# Patient Record
Sex: Male | Born: 1990
Health system: Southern US, Community
[De-identification: ages and names within clinical notes are randomized; demographics above are authoritative.]

## PROBLEM LIST (undated history)

## (undated) DIAGNOSIS — K7460 Unspecified cirrhosis of liver: Secondary | ICD-10-CM

## (undated) DIAGNOSIS — K8309 Other cholangitis: Secondary | ICD-10-CM

## (undated) DIAGNOSIS — K754 Autoimmune hepatitis: Secondary | ICD-10-CM

## (undated) HISTORY — DX: Unspecified cirrhosis of liver: K74.60

## (undated) HISTORY — PX: LIVER BIOPSY: SHX301

## (undated) HISTORY — DX: Autoimmune hepatitis: K75.4

## (undated) HISTORY — DX: Other cholangitis: K83.09

---
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
## 2010-06-28 ENCOUNTER — Ambulatory Visit: Admit: 2010-06-28 | Payer: Self-pay | Admitting: Gastroenterology

## 2010-06-28 DIAGNOSIS — B199 Unspecified viral hepatitis without hepatic coma: Secondary | ICD-10-CM

## 2010-08-23 LAB — DIFFERENTIAL
Basophils Absolute: 0 10*3/uL (ref 0.0–0.1)
Basophils Relative: 1 % (ref 0–1)
Lymphocytes Relative: 30 % (ref 12–46)
Monocytes Absolute: 0.6 10*3/uL (ref 0.1–1.0)
Neutro Abs: 2.6 10*3/uL (ref 1.7–7.7)

## 2010-08-23 LAB — APTT: aPTT: 43 seconds — ABNORMAL HIGH (ref 24–37)

## 2010-08-23 LAB — PROTIME-INR: INR: 1.9 — ABNORMAL HIGH (ref 0.00–1.49)

## 2010-08-23 LAB — CBC
MCHC: 33.1 g/dL (ref 30.0–36.0)
MCV: 96.3 fL (ref 78.0–100.0)
WBC: 4.9 10*3/uL (ref 4.0–10.5)

## 2010-12-10 DIAGNOSIS — K754 Autoimmune hepatitis: Secondary | ICD-10-CM | POA: Insufficient documentation

## 2011-06-22 ENCOUNTER — Ambulatory Visit (INDEPENDENT_AMBULATORY_CARE_PROVIDER_SITE_OTHER): Payer: Commercial Managed Care - PPO | Admitting: Internal Medicine

## 2011-06-22 DIAGNOSIS — B199 Unspecified viral hepatitis without hepatic coma: Secondary | ICD-10-CM

## 2011-08-03 ENCOUNTER — Ambulatory Visit (INDEPENDENT_AMBULATORY_CARE_PROVIDER_SITE_OTHER): Payer: Commercial Managed Care - PPO | Admitting: Internal Medicine

## 2011-08-03 DIAGNOSIS — Z23 Encounter for immunization: Secondary | ICD-10-CM

## 2011-08-03 DIAGNOSIS — K754 Autoimmune hepatitis: Secondary | ICD-10-CM

## 2011-08-03 MED ORDER — HEPATITIS A VACCINE 1440 EL U/ML IM SUSP
1.0000 mL | Freq: Once | INTRAMUSCULAR | Status: AC
  Administered 2011-08-03: 1440 [IU] via INTRAMUSCULAR

## 2011-08-03 MED ORDER — HEPATITIS B VAC RECOMBINANT 5 MCG/0.5ML IJ SUSP
0.5000 mL | Freq: Once | INTRAMUSCULAR | Status: AC
  Administered 2011-08-03: 20 ug via INTRAMUSCULAR

## 2011-08-03 MED ORDER — HEPATITIS B VAC RECOMBINANT 20 MCG/ML IM INJ: 20.0000 ug | INJECTION | Freq: Once | INTRAMUSCULAR | Status: DC

## 2011-08-03 NOTE — Progress Notes (Signed)
  Subjective:    Patient ID: Thomas Terrell, male    DOB: [birth year], 21 y.o.   MRN: [id number]  HPI Here for hepatitis A vaccine #2 and hepatitis B vaccine #3 to complete his course as needed for treatment of his autoimmune hepatitis   Review of SystemsProblems from her last visit in January resolved     Objective:   Physical ExamNone needed today        Assessment & Plan:  Problem #1Autoimmune hepatitis  Vaccines given

## 2011-10-25 ENCOUNTER — Encounter: Payer: Self-pay | Admitting: Internal Medicine

## 2011-10-28 ENCOUNTER — Ambulatory Visit: Payer: Commercial Managed Care - PPO

## 2011-10-28 ENCOUNTER — Ambulatory Visit (INDEPENDENT_AMBULATORY_CARE_PROVIDER_SITE_OTHER): Payer: Commercial Managed Care - PPO | Admitting: Family Medicine

## 2011-10-28 VITALS — BP 128/76 | HR 73 | Temp 98.2°F | Resp 18 | Ht 70.0 in | Wt 206.8 lb

## 2011-10-28 DIAGNOSIS — T148XXA Other injury of unspecified body region, initial encounter: Secondary | ICD-10-CM

## 2011-10-28 MED ORDER — DOXYCYCLINE HYCLATE 100 MG PO TABS: 100.0000 mg | ORAL_TABLET | Freq: Two times a day (BID) | ORAL | Status: AC

## 2011-10-28 NOTE — Progress Notes (Signed)
  Subjective:    Patient ID: Thomas Terrell, male    DOB: 07/17/1990, 21 y.o.   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 Screw removed without difficulty however yesterday developed some redness and induration of that digit.  Patient denies pain or limitation in ROM  Needs Tdap.   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  Cardiovascular: Normal rate, regular rhythm and normal heart sounds.   Pulmonary/Chest: Effort normal and breath sounds normal.  Abdominal: Soft. Bowel sounds are normal.  Musculoskeletal:       Hands:    [facility] reading (PRIMARY) by  Dr. Hal Hope No retained metallic fragment or fracture     Assessment & Plan:  Traumatic injury (L) 3rd finger; early secondary infection NAFLD/ Sclerosing cholangitis/ auto immune hepatitis/ immunosuppresion   Doxycycline 100 mg BID X 10 days RTC if symptoms worsen  Tdap Mother and patient in agreement with plan

## 2011-12-26 ENCOUNTER — Ambulatory Visit (INDEPENDENT_AMBULATORY_CARE_PROVIDER_SITE_OTHER): Payer: Commercial Managed Care - PPO | Admitting: Internal Medicine

## 2011-12-26 VITALS — BP 120/70 | HR 56 | Temp 98.0°F | Resp 16 | Ht 70.5 in | Wt 187.0 lb

## 2011-12-26 DIAGNOSIS — L02519 Cutaneous abscess of unspecified hand: Secondary | ICD-10-CM

## 2011-12-26 DIAGNOSIS — L03019 Cellulitis of unspecified finger: Secondary | ICD-10-CM

## 2011-12-26 MED ORDER — DOXYCYCLINE HYCLATE 100 MG PO TABS: 100.0000 mg | ORAL_TABLET | Freq: Two times a day (BID) | ORAL | Status: DC

## 2011-12-26 NOTE — Progress Notes (Signed)
  Subjective:    Patient ID: Thomas Terrell, male    DOB: Dec 08, 1990, 21 y.o.   MRN: [id number]  HPIRecurrence of finger infection Originally responded well but has had tenderness redness and slight pus over the last week The nail is disrupted medially and has begun to flake off with the distal nail intact No new injury    Review of Systems     Objective:   Physical Exam  Nail changes as noted/there is a fistula tract on the medial aspect of the finger distal phalanx with tenderness and slight redness but no pus and no involvement of the PIP joint      Assessment & Plan:  Problem #1 relapse of finger infection Plan-doxycycline 100 mg twice a day for 10 days If this does not resolve we may need to consider a retained foreign body or even a low-grade osteomyelitis

## 2012-01-16 ENCOUNTER — Ambulatory Visit (INDEPENDENT_AMBULATORY_CARE_PROVIDER_SITE_OTHER): Payer: Commercial Managed Care - PPO | Admitting: Emergency Medicine

## 2012-01-16 ENCOUNTER — Ambulatory Visit: Payer: Commercial Managed Care - PPO

## 2012-01-16 VITALS — BP 92/54 | HR 62 | Temp 97.8°F | Resp 18 | Wt 202.0 lb

## 2012-01-16 DIAGNOSIS — M79609 Pain in unspecified limb: Secondary | ICD-10-CM

## 2012-01-16 DIAGNOSIS — M79646 Pain in unspecified finger(s): Secondary | ICD-10-CM

## 2012-01-16 NOTE — Progress Notes (Signed)
   Date:  01/16/2012   Name:  Thomas Terrell   DOB:  Jul 23, 1990   MRN:  [id number] Gender: male  Age: 21 y.o.  PCP:  Tonye Pearson, MD    Chief Complaint: Follow-up   History of Present Illness:  Thomas Terrell is a 21 y.o. pleasant patient who presents with the following:  Injured several weeks ago when his brother drove a wood screw into his middle finger.  Initially did well but developed infection and treated with doxy.  Now for wound recheck and still having pain and insensitivity.  No further drainage.  There is no problem list on file for this patient.   No past medical history on file.  No past surgical history on file.  History  Substance Use Topics  . Smoking status: Never Smoker   . Smokeless tobacco: Not on file  . Alcohol Use: Not on file    No family history on file.  Allergies  Allergen Reactions  . Latex Swelling    Redness and swelling.    Medication list has been reviewed and updated.  Current Outpatient Prescriptions on File Prior to Visit  Medication Sig Dispense Refill  . AzaTHIOprine (IMURAN PO) Take 150 mg by mouth daily.      . predniSONE (STERAPRED UNI-PAK) 10 MG tablet Take 10 mg by mouth daily.      Marland Kitchen PREDNISONE PO Take 7.5 mg by mouth daily.       Current Facility-Administered Medications on File Prior to Visit  Medication Dose Route Frequency Provider Last Rate Last Dose  . Hepatitis B Vac Recombinant INJ 20 mcg  20 mcg Intramuscular Once Tonye Pearson, MD        Review of Systems:  As per HPI, otherwise negative.    Physical Examination: Filed Vitals:   01/16/12 1348  BP: 92/54  Pulse: 62  Temp: 97.8 F (36.6 C)  Resp: 18   Filed Vitals:   01/16/12 1348  Weight: 202 lb (91.627 kg)   There is no height on file to calculate BMI. Ideal Body Weight:     GEN: WDWN, NAD, Non-toxic, Alert & Oriented x 3 HEENT: Atraumatic, Normocephalic.  Ears and Nose: No external deformity. EXTR: No  clubbing/cyanosis/edema NEURO: Normal gait.  PSYCH: Normally interactive. Conversant. Not depressed or anxious appearing.  Calm demeanor.  Finger lateral nail fold somewhat deformed.  No drainage.  Normal sensation .  Full PROM and AROM  Assessment and Plan: Finger pain Refer hand surgeon  [facility] reading (PRIMARY) by  Dr. Dareen Piano.  Negative .    Carmelina Dane, MD

## 2012-04-21 ENCOUNTER — Ambulatory Visit (INDEPENDENT_AMBULATORY_CARE_PROVIDER_SITE_OTHER): Payer: 59 | Admitting: Internal Medicine

## 2012-04-21 VITALS — BP 117/68 | HR 87 | Temp 98.5°F | Resp 16 | Ht 72.0 in | Wt 193.8 lb

## 2012-04-21 DIAGNOSIS — Z23 Encounter for immunization: Secondary | ICD-10-CM

## 2012-04-21 DIAGNOSIS — H6120 Impacted cerumen, unspecified ear: Secondary | ICD-10-CM

## 2012-04-21 DIAGNOSIS — H9203 Otalgia, bilateral: Secondary | ICD-10-CM

## 2012-04-21 DIAGNOSIS — H9209 Otalgia, unspecified ear: Secondary | ICD-10-CM

## 2012-04-21 DIAGNOSIS — K754 Autoimmune hepatitis: Secondary | ICD-10-CM

## 2012-04-21 MED ORDER — AMOXICILLIN 875 MG PO TABS: 875.0000 mg | ORAL_TABLET | Freq: Two times a day (BID) | ORAL | Status: DC

## 2012-04-22 DIAGNOSIS — K754 Autoimmune hepatitis: Secondary | ICD-10-CM | POA: Insufficient documentation

## 2012-04-22 NOTE — Progress Notes (Signed)
  Subjective:    Patient ID: Thomas Terrell, male    DOB: 1991-02-16, 21 y.o.   MRN: [id number]  HPIotalgia-2 days [place] Peers 2 weeks decr hear R No ST,cough  Needs flu sh    Review of Systems     Objective:   Physical Exam NAD R canal occluded L canal clear dull tm Nares boggy thr clear No nodes   Irrigation successful-and R Tm rd w/ fluid     Assessment & Plan:   1. Cerumen impaction    2. Needs flu shot  Flu vaccine greater than or equal to 3yo preservative free IM  3. Otalgia of both ears    4. Hepatitis, autoimmune    5.  OM on R Meds ordered this encounter  Medications  . amoxicillin (AMOXIL) 875 MG tablet    Sig: Take 1 tablet (875 mg total) by mouth 2 (two) times daily.    Dispense:  20 tablet    Refill:  0

## 2013-04-24 ENCOUNTER — Ambulatory Visit (INDEPENDENT_AMBULATORY_CARE_PROVIDER_SITE_OTHER): Payer: 59 | Admitting: Family Medicine

## 2013-04-24 DIAGNOSIS — Z23 Encounter for immunization: Secondary | ICD-10-CM

## 2013-06-06 ENCOUNTER — Ambulatory Visit: Payer: 59

## 2013-06-06 ENCOUNTER — Ambulatory Visit (INDEPENDENT_AMBULATORY_CARE_PROVIDER_SITE_OTHER): Payer: 59 | Admitting: Family Medicine

## 2013-06-06 VITALS — BP 122/74 | HR 79 | Temp 98.0°F | Resp 16 | Ht 73.8 in | Wt 198.0 lb

## 2013-06-06 DIAGNOSIS — R0781 Pleurodynia: Secondary | ICD-10-CM

## 2013-06-06 DIAGNOSIS — R071 Chest pain on breathing: Secondary | ICD-10-CM

## 2013-06-06 DIAGNOSIS — J069 Acute upper respiratory infection, unspecified: Secondary | ICD-10-CM

## 2013-06-06 DIAGNOSIS — K754 Autoimmune hepatitis: Secondary | ICD-10-CM

## 2013-06-06 LAB — POCT CBC
GRANULOCYTE PERCENT: 65.2 % (ref 37–80)
HEMATOCRIT: 49.7 % (ref 43.5–53.7)
HEMOGLOBIN: 15.9 g/dL (ref 14.1–18.1)
Lymph, poc: 1.2 (ref 0.6–3.4)
MCH, POC: 31.4 pg — AB (ref 27–31.2)
MCHC: 32 g/dL (ref 31.8–35.4)
MCV: 98.2 fL — AB (ref 80–97)
MID (CBC): 0.5 (ref 0–0.9)
MPV: 10.7 fL (ref 0–99.8)
POC GRANULOCYTE: 3.1 (ref 2–6.9)
POC LYMPH PERCENT: 24.5 %L (ref 10–50)
POC MID %: 10.3 % (ref 0–12)
Platelet Count, POC: 149 10*3/uL (ref 142–424)
RBC: 5.06 M/uL (ref 4.69–6.13)
RDW, POC: 15.4 %
WBC: 4.7 10*3/uL (ref 4.6–10.2)

## 2013-06-06 MED ORDER — AMOXICILLIN-POT CLAVULANATE 875-125 MG PO TABS: 1.0000 | ORAL_TABLET | Freq: Two times a day (BID) | ORAL | Status: DC

## 2013-06-06 MED ORDER — BENZONATATE 100 MG PO CAPS: 100.0000 mg | ORAL_CAPSULE | Freq: Three times a day (TID) | ORAL | Status: DC | PRN

## 2013-06-06 MED ORDER — IPRATROPIUM BROMIDE 0.03 % NA SOLN: 2.0000 | Freq: Two times a day (BID) | NASAL | Status: DC

## 2013-06-06 NOTE — Patient Instructions (Signed)

## 2013-06-06 NOTE — Progress Notes (Signed)
Subjective:    Patient ID: Thomas Terrell, male    DOB: [birth year], 23 y.o.   MRN: [id number]  HPI  Scribed for Nilda SimmerKristi Smith MD, the patient was seen in room 1. This chart was scribed by Lewanda RifeAlexandra Hurtado, ED scribe. Patient's care was started at 8:53 AM Hx was provided by the pt.  HPI Comments: Thomas Terrell is a 23 y.o. male who presents to the [facility] with PMHx of pneumonia, autoimmune hepatitis complaining of constant moderate sore throat and cough onset 7 days. Reports associated mild productive cough with yellow sputum, rhinorrhea with clear mucus, congestion, and difficulty breathing; +pain with deep inspiration B anterior chest. Reports trying dayquil and nyquil with no relief of symptoms. Denies associated fever, chills, nausea, emesis, diarrhea, wheeze, generalized myalgias, otalgia, and sweats. Reports siblings sick contacts. Denies PMHx of asthma and smoking. Reports getting flu shot this year.  Past Medical History  Diagnosis Date  . Autoimmune hepatitis treated with steroids     History reviewed. No pertinent past surgical history.  Family History  Problem Relation Age of Onset  . Diabetes Father   . Heart disease Father   . Hypertension Father     History   Social History  . Marital Status: Single    Spouse Name: N/A    Number of Children: N/A  . Years of Education: N/A   Occupational History  . Not on file.   Social History Main Topics  . Smoking status: Never Smoker   . Smokeless tobacco: Not on file  . Alcohol Use: No  . Drug Use: No  . Sexual Activity: No   Other Topics Concern  . Not on file   Social History Narrative  . No narrative on file    Allergies  Allergen Reactions  . Latex Swelling    Redness and swelling.    Patient Active Problem List   Diagnosis Date Noted  . Hepatitis, autoimmune 04/22/2012      Review of Systems  Constitutional: Negative for fever, chills, diaphoresis and fatigue.  HENT: Positive  for congestion, postnasal drip, rhinorrhea, sinus pressure and sore throat. Negative for ear pain and trouble swallowing.   Respiratory: Positive for cough. Negative for shortness of breath and wheezing.   Gastrointestinal: Negative.  Negative for nausea, vomiting and diarrhea.  Skin: Negative for rash.   A complete 10 system review of systems was obtained and all systems are negative except as noted in the HPI and PMHx.       Objective:   Physical Exam  Nursing note and vitals reviewed. Constitutional: He is oriented to person, place, and time. He appears well-developed and well-nourished. No distress.  HENT:  Head: Normocephalic and atraumatic.  Right Ear: Tympanic membrane, external ear and ear canal normal.  Left Ear: Tympanic membrane, external ear and ear canal normal.  Nose: Mucosal edema and rhinorrhea present. Right sinus exhibits maxillary sinus tenderness. Right sinus exhibits no frontal sinus tenderness. Left sinus exhibits maxillary sinus tenderness. Left sinus exhibits no frontal sinus tenderness.  Mouth/Throat: Uvula is midline and mucous membranes are normal. Mucous membranes are not dry. No uvula swelling. No oropharyngeal exudate, posterior oropharyngeal edema, posterior oropharyngeal erythema or tonsillar abscesses.  Eyes: Conjunctivae and EOM are normal. Pupils are equal, round, and reactive to light.  Neck: Normal range of motion. Neck supple. No tracheal deviation present.  Cardiovascular: Normal rate and regular rhythm.   No murmur heard. Pulmonary/Chest: Effort normal and breath sounds normal. No  respiratory distress. He has no wheezes. He has no rales.  Musculoskeletal: Normal range of motion.  Lymphadenopathy:    He has no cervical adenopathy.  Neurological: He is alert and oriented to person, place, and time.  Skin: Skin is warm and dry. He is not diaphoretic.  Psychiatric: He has a normal mood and affect. His behavior is normal.      DIAGNOSTIC  STUDIES: Oxygen Saturation is 100% on room air, normal by my interpretation.    COORDINATION OF CARE:  Nursing notes reviewed. Vital signs reviewed. Initial pt interview and examination performed.   8:53 AM-Discussed work up plan with pt at bedside, which includes CXR, and CBC with diff panel. Pt agrees with plan.  Treatment plan initiated:Medications - No data to display   Initial diagnostic testing ordered.    Results for orders placed in visit on 06/06/13  POCT CBC      Result Value Range   WBC 4.7  4.6 - 10.2 K/uL   Lymph, poc 1.2  0.6 - 3.4   POC LYMPH PERCENT 24.5  10 - 50 %L   MID (cbc) 0.5  0 - 0.9   POC MID % 10.3  0 - 12 %M   POC Granulocyte 3.1  2 - 6.9   Granulocyte percent 65.2  37 - 80 %G   RBC 5.06  4.69 - 6.13 M/uL   Hemoglobin 15.9  14.1 - 18.1 g/dL   HCT, POC 57.8  46.9 - 53.7 %   MCV 98.2 (*) 80 - 97 fL   MCH, POC 31.4 (*) 27 - 31.2 pg   MCHC 32.0  31.8 - 35.4 g/dL   RDW, POC 62.9     Platelet Count, POC 149  142 - 424 K/uL   MPV 10.7  0 - 99.8 fL   [facility] reading (PRIMARY) by  Dr. Katrinka Blazing.  CXR:  NAD.      Assessment & Plan:  URI (upper respiratory infection) - Plan: POCT CBC, DG Chest 2 View  Pleuritic chest pain  Hepatitis, autoimmune  1.  Acute Sinusitis/URI:  New. Rx for Augmentin bid provided, Atrovent nasal spray, Tessalon Perles.  RTC for acute worsening or lack of improvement. 2.  Chest pain pleuritic:  New.  No evidence of infiltrate by CXR.  Secondary to coughing. 3.  Autoimmune Hepatitis:  Stable; maintained on Prednisone daily.    Meds ordered this encounter  Medications  . amoxicillin-clavulanate (AUGMENTIN) 875-125 MG per tablet    Sig: Take 1 tablet by mouth 2 (two) times daily.    Dispense:  20 tablet    Refill:  0  . ipratropium (ATROVENT) 0.03 % nasal spray    Sig: Place 2 sprays into the nose 2 (two) times daily.    Dispense:  30 mL    Refill:  0  . benzonatate (TESSALON) 100 MG capsule    Sig: Take 1-2 capsules (100-200  mg total) by mouth 3 (three) times daily as needed for cough.    Dispense:  40 capsule    Refill:  0   I personally performed the services described in this documentation, which was scribed in my presence.  The recorded information has been reviewed and is accurate.  Nilda Simmer, M.D.  [facility] [address] [place], [place]  [phone] [phone] phone [phone] fax

## 2013-08-15 ENCOUNTER — Ambulatory Visit (INDEPENDENT_AMBULATORY_CARE_PROVIDER_SITE_OTHER): Payer: 59 | Admitting: Family Medicine

## 2013-08-15 VITALS — BP 122/70 | HR 66 | Temp 98.2°F | Resp 18 | Ht 70.0 in | Wt 189.0 lb

## 2013-08-15 DIAGNOSIS — J209 Acute bronchitis, unspecified: Secondary | ICD-10-CM

## 2013-08-15 DIAGNOSIS — L600 Ingrowing nail: Secondary | ICD-10-CM

## 2013-08-15 MED ORDER — DOXYCYCLINE HYCLATE 100 MG PO TABS
100.0000 mg | ORAL_TABLET | Freq: Two times a day (BID) | ORAL | Status: AC
Start: 2013-08-15 — End: 2013-08-25

## 2013-08-15 MED ORDER — HYDROCODONE-HOMATROPINE 5-1.5 MG/5ML PO SYRP: 5.0000 mL | ORAL_SOLUTION | Freq: Three times a day (TID) | ORAL | Status: DC | PRN

## 2013-08-15 NOTE — Progress Notes (Signed)
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 He's had 2 weeks of cough in 2 weeks of ingrown toenail on the right great toe.  Patient was seen over week ago and put on Tessalon Perles as well as Augmentin. He has not improved at all.  Patient's past medical history is significant for chronic hepatitis thought to be autoimmune. He takes prednisone 5 mg daily.  Objective: Patient in no acute distress, alert and cooperative HEENT: Unremarkable Chest: A few faint wheezes Heart: Regular no murmur Skin: Patient does have swollen cuticle on the right toe, tibial side of the nail.  Assessment: Persistent, probably cyclical cough with an ingrown toenail which is not associated with the lung symptoms.  Plan:Acute bronchitis - Plan: doxycycline (VIBRA-TABS) 100 MG tablet, HYDROcodone-homatropine (HYCODAN) 5-1.5 MG/5ML syrup  Ingrown right greater toenail - Plan: doxycycline (VIBRA-TABS) 100 MG tablet  Signed, Elvina SidleKurt Tonga Prout, MD

## 2013-08-15 NOTE — Patient Instructions (Signed)

## 2014-03-24 ENCOUNTER — Telehealth: Payer: Self-pay

## 2014-03-24 DIAGNOSIS — K754 Autoimmune hepatitis: Secondary | ICD-10-CM

## 2014-03-24 NOTE — Telephone Encounter (Signed)
Pt mom states that it has been a while with [facility] and needs a referral for the follow up   Please call [phone]

## 2014-03-26 NOTE — Telephone Encounter (Signed)
Done

## 2014-03-26 NOTE — Telephone Encounter (Signed)
Ok to refer.

## 2014-06-09 ENCOUNTER — Ambulatory Visit (INDEPENDENT_AMBULATORY_CARE_PROVIDER_SITE_OTHER): Payer: 59 | Admitting: Family Medicine

## 2014-06-09 VITALS — BP 142/70 | HR 69 | Temp 97.3°F | Resp 16 | Ht 72.05 in | Wt 213.0 lb

## 2014-06-09 DIAGNOSIS — J0100 Acute maxillary sinusitis, unspecified: Secondary | ICD-10-CM

## 2014-06-09 DIAGNOSIS — J069 Acute upper respiratory infection, unspecified: Secondary | ICD-10-CM

## 2014-06-09 MED ORDER — AMOXICILLIN 500 MG PO CAPS
500.0000 mg | ORAL_CAPSULE | Freq: Three times a day (TID) | ORAL | Status: DC
Start: 2014-06-09 — End: 2014-07-09

## 2014-06-09 NOTE — Progress Notes (Signed)
Chief Complaint:  Chief Complaint  Patient presents with  . Cough    x4 days; clear to yellow sputum  . Nasal Congestion    HPI: Thomas Terrell is a 24 y.o. male who is here for  5  Day history of feeling terrible, he has had sick contacts, sister with similar sxs and dad has PNA.  Coughing up yellow sputum, not since Friday He had had chills last night and ahs been taking dayquil and nyquil He has facial pain and HA, mild ear pain.  His liver enzymes have been back to normal.    Past Medical History  Diagnosis Date  . Autoimmune hepatitis treated with steroids    History reviewed. No pertinent past surgical history. History   Social History  . Marital Status: Single    Spouse Name: N/A    Number of Children: N/A  . Years of Education: N/A   Social History Main Topics  . Smoking status: Never Smoker   . Smokeless tobacco: None  . Alcohol Use: No  . Drug Use: No  . Sexual Activity: No   Other Topics Concern  . None   Social History Narrative   Family History  Problem Relation Age of Onset  . Diabetes Father   . Heart disease Father   . Hypertension Father    Allergies  Allergen Reactions  . Latex Swelling    Redness and swelling.   Prior to Admission medications   Medication Sig Start Date End Date Taking? Authorizing Provider  AzaTHIOprine (IMURAN PO) Take 100 mg by mouth daily.    Yes Historical Provider, MD  predniSONE (DELTASONE) 5 MG tablet Take 5 mg by mouth daily with breakfast.   Yes Historical Provider, MD     ROS: The patient denies night sweats, unintentional weight loss, chest pain, palpitations, wheezing, dyspnea on exertion, nausea, vomiting, abdominal pain, dysuria, hematuria, melena, numbness, weakness, or tingling.   All other systems have been reviewed and were otherwise negative with the exception of those mentioned in the HPI and as above.    PHYSICAL EXAM: Filed Vitals:   06/09/14 1035  BP: 142/70  Pulse: 69  Temp: 97.3  F (36.3 C)  Resp: 16   Filed Vitals:   06/09/14 1035  Height: 6' 0.05" (1.83 m)  Weight: 213 lb (96.616 kg)   Body mass index is 28.85 kg/(m^2).  General: Alert, no acute distress HEENT:  Normocephalic, atraumatic, oropharynx patent. EOMI, PERRLA Erythematous throat, no exudates, TM normal, + sinus tenderness, + erythematous/boggy nasal mucosa Cardiovascular:  Regular rate and rhythm, no rubs murmurs or gallops.  Radial pulse intact. No pedal edema.  Respiratory: Clear to auscultation bilaterally.  No wheezes, rales, or rhonchi.  No cyanosis, no use of accessory musculature GI: No organomegaly, abdomen is soft and non-tender, positive bowel sounds.  No masses. Skin: No rashes. Neurologic: Facial musculature symmetric. Psychiatric: Patient is appropriate throughout our interaction. Lymphatic: No cervical lymphadenopathy Musculoskeletal: Gait intact.   LABS: Results for orders placed or performed in visit on 06/06/13  POCT CBC  Result Value Ref Range   WBC 4.7 4.6 - 10.2 K/uL   Lymph, poc 1.2 0.6 - 3.4   POC LYMPH PERCENT 24.5 10 - 50 %L   MID (cbc) 0.5 0 - 0.9   POC MID % 10.3 0 - 12 %M   POC Granulocyte 3.1 2 - 6.9   Granulocyte percent 65.2 37 - 80 %G   RBC 5.06 4.69 - 6.13  M/uL   Hemoglobin 15.9 14.1 - 18.1 g/dL   HCT, POC 52.8 41.3 - 53.7 %   MCV 98.2 (A) 80 - 97 fL   MCH, POC 31.4 (A) 27 - 31.2 pg   MCHC 32.0 31.8 - 35.4 g/dL   RDW, POC 24.4 %   Platelet Count, POC 149 142 - 424 K/uL   MPV 10.7 0 - 99.8 fL     EKG/XRAY:   Primary read interpreted by Dr. Conley Rolls at [facility].   ASSESSMENT/PLAN: Encounter Diagnoses  Name Primary?  . Acute upper respiratory infection Yes  . Acute maxillary sinusitis, recurrence not specified    Rx amoxacillin Saline ns F/u prn  Gross sideeffects, risk and benefits, and alternatives of medications d/w patient. Patient is aware that all medications have potential sideeffects and we are unable to predict every sideeffect or drug-drug  interaction that may occur.  LE, [name], DO 06/09/2014 11:40 AM

## 2014-06-09 NOTE — Patient Instructions (Signed)

## 2014-07-09 ENCOUNTER — Encounter: Payer: Self-pay | Admitting: Internal Medicine

## 2014-07-09 ENCOUNTER — Ambulatory Visit (INDEPENDENT_AMBULATORY_CARE_PROVIDER_SITE_OTHER): Payer: 59 | Admitting: Internal Medicine

## 2014-07-09 VITALS — BP 137/78 | HR 82 | Temp 98.0°F | Resp 16 | Ht 70.5 in | Wt 211.0 lb

## 2014-07-09 DIAGNOSIS — Z92241 Personal history of systemic steroid therapy: Secondary | ICD-10-CM

## 2014-07-09 DIAGNOSIS — K754 Autoimmune hepatitis: Secondary | ICD-10-CM

## 2014-07-09 DIAGNOSIS — Z0000 Encounter for general adult medical examination without abnormal findings: Secondary | ICD-10-CM

## 2014-07-09 DIAGNOSIS — Z23 Encounter for immunization: Secondary | ICD-10-CM

## 2014-07-09 MED ORDER — PREDNISONE 5 MG PO TABS: 5.0000 mg | ORAL_TABLET | Freq: Every day | ORAL | Status: DC

## 2014-07-10 DIAGNOSIS — Z92241 Personal history of systemic steroid therapy: Secondary | ICD-10-CM | POA: Insufficient documentation

## 2014-07-10 NOTE — Progress Notes (Signed)
   Subjective:    Patient ID: Thomas Terrell, male    DOB: Feb 09, 1991, 24 y.o.   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 [facility] for autoimmune hepatitis 03/07/2014 [facility]: WBC 5.5, HGb 15, Hct 45, platelets 151,000, ALT 33, AST 27, AP 128, GGT 130, T bili 0.6, albumin 4.3. He continues on prednisone 5 mg daily and Imuran 100 mg daily  Immunizations are up-to-date His father whom he resembles morphologically has significant degenerative disease throughout his spine, severe asthma, and anger management disorder.  Review of Systems 14 point review of systems is negative per form    Objective:   Physical Exam  Constitutional: He is oriented to person, place, and time. He appears well-developed and well-nourished.  HENT:  Head: Normocephalic and atraumatic.  Right Ear: Hearing, tympanic membrane, external ear and ear canal normal.  Left Ear: Hearing, tympanic membrane, external ear and ear canal normal.  Nose: Nose normal.  Mouth/Throat: Uvula is midline, oropharynx is clear and moist and mucous membranes are normal.  Eyes: Conjunctivae, EOM and lids are normal. Pupils are equal, round, and reactive to light. Right eye exhibits no discharge. Left eye exhibits no discharge. No scleral icterus.  Neck: Normal range of motion. Neck supple. Carotid bruit is not present. No thyromegaly present.  Cardiovascular: Normal rate, regular rhythm, normal heart sounds, intact distal pulses and normal pulses.   No murmur heard. Pulmonary/Chest: Effort normal and breath sounds normal. No respiratory distress. He has no wheezes. He has no rhonchi. He has no rales.  Abdominal: Soft. Normal appearance and bowel sounds are normal. He exhibits no abdominal bruit and no mass. There is no tenderness.    Musculoskeletal: Normal range of motion. He exhibits no edema or tenderness.  Lymphadenopathy:    He has no cervical adenopathy.  Neurological: He is alert and oriented to person, place, and time. He has normal strength and normal reflexes. No cranial nerve deficit or sensory deficit. Coordination and gait normal.  Skin: Skin is warm, dry and intact. No lesion and no rash noted.  Psychiatric: He has a normal mood and affect. His speech is normal and behavior is normal. Judgment and thought content normal.  BP 137/78 mmHg  Pulse 82  Temp(Src) 98 F (36.7 C)  Resp 16  Ht 5' 10.5" (1.791 m)  Wt 211 lb (95.709 kg)  BMI 29.84 kg/m2  SpO2 99%     Assessment & Plan:  Annual exam Autoimmune hepatitis stable on current regimen Chronic steroid use  DEXA scan  Follow-up one year

## 2014-07-18 ENCOUNTER — Ambulatory Visit (HOSPITAL_COMMUNITY)
Admission: RE | Admit: 2014-07-18 | Discharge: 2014-07-18 | Disposition: A | Discharge status: Home / Self Care | Payer: 59 | Source: Ambulatory Visit | Attending: Internal Medicine | Admitting: Internal Medicine

## 2014-07-18 DIAGNOSIS — Z13820 Encounter for screening for osteoporosis: Secondary | ICD-10-CM | POA: Diagnosis present

## 2014-07-18 DIAGNOSIS — K754 Autoimmune hepatitis: Secondary | ICD-10-CM | POA: Diagnosis not present

## 2014-07-18 DIAGNOSIS — Z92241 Personal history of systemic steroid therapy: Secondary | ICD-10-CM | POA: Diagnosis not present

## 2014-08-04 ENCOUNTER — Encounter: Payer: Self-pay | Admitting: Radiology

## 2014-08-07 ENCOUNTER — Encounter: Payer: Self-pay | Admitting: Internal Medicine

## 2014-12-02 ENCOUNTER — Ambulatory Visit (INDEPENDENT_AMBULATORY_CARE_PROVIDER_SITE_OTHER): Payer: 59 | Admitting: Family Medicine

## 2014-12-02 VITALS — BP 120/80 | HR 60 | Temp 97.8°F | Resp 16 | Ht 72.0 in | Wt 229.0 lb

## 2014-12-02 DIAGNOSIS — H8110 Benign paroxysmal vertigo, unspecified ear: Secondary | ICD-10-CM

## 2014-12-02 MED ORDER — ONDANSETRON HCL 8 MG PO TABS: 8.0000 mg | ORAL_TABLET | Freq: Three times a day (TID) | ORAL | Status: DC | PRN

## 2014-12-02 MED ORDER — MECLIZINE HCL 25 MG PO TABS: 25.0000 mg | ORAL_TABLET | Freq: Three times a day (TID) | ORAL | Status: DC | PRN

## 2014-12-02 NOTE — Patient Instructions (Signed)
It seems that you have BPPV- a type of vertigo.  This will generally resolve in a couple of days.  Use the meclizine as needed for dizziness and the zofran as needed for nausea Let me know if you are not better soon- Sooner if worse.   Do not drive until the dizziness resolves   hBenign Positional Vertigo Vertigo means you feel like you or your surroundings are moving when they are not. Benign positional vertigo is the most common form of vertigo. Benign means that the cause of your condition is not serious. Benign positional vertigo is more common in older adults. CAUSES  Benign positional vertigo is the result of an upset in the labyrinth system. This is an area in the middle ear that helps control your balance. This may be caused by a viral infection, head injury, or repetitive motion. However, often no specific cause is found. SYMPTOMS  Symptoms of benign positional vertigo occur when you move your head or eyes in different directions. Some of the symptoms may include:  Loss of balance and falls.  Vomiting.  Blurred vision.  Dizziness.  Nausea.  Involuntary eye movements (nystagmus). DIAGNOSIS  Benign positional vertigo is usually diagnosed by physical exam. If the specific cause of your benign positional vertigo is unknown, your caregiver may perform imaging tests, such as magnetic resonance imaging (MRI) or computed tomography (CT). TREATMENT  Your caregiver may recommend movements or procedures to correct the benign positional vertigo. Medicines such as meclizine, benzodiazepines, and medicines for nausea may be used to treat your symptoms. In rare cases, if your symptoms are caused by certain conditions that affect the inner ear, you may need surgery. HOME CARE INSTRUCTIONS   Follow your caregiver's instructions.  Move slowly. Do not make sudden body or head movements.  Avoid driving.  Avoid operating heavy machinery.  Avoid performing any tasks that would be dangerous to  you or others during a vertigo episode.  Drink enough fluids to keep your urine clear or pale yellow. SEEK IMMEDIATE MEDICAL CARE IF:   You develop problems with walking, weakness, numbness, or using your arms, hands, or legs.  You have difficulty speaking.  You develop severe headaches.  Your nausea or vomiting continues or gets worse.  You develop visual changes.  Your family or friends notice any behavioral changes.  Your condition gets worse.  You have a fever.  You develop a stiff neck or sensitivity to light. MAKE SURE YOU:   Understand these instructions.  Will watch your condition.  Will get help right away if you are not doing well or get worse. Document Released: 02/28/2006 Document Revised: 08/15/2011 Document Reviewed: 02/10/2011 [facility] Patient Information 2015 [employer] [place]. This information is not intended to replace advice given to you by your health care provider. Make sure you discuss any questions you have with your health care provider.

## 2014-12-02 NOTE — Progress Notes (Signed)
[facility] [address] [place] [address] [phone]  Date:  12/02/2014   Name:  Thomas Terrell   DOB:  November 26, 1990   MRN:  [id number]  PCP:  Tonye Pearson, MD    Chief Complaint: Dizziness; Headache; and Emesis   History of Present Illness:  Thomas Terrell is a 24 y.o. very pleasant male patient who presents with the following:  History of autoimmune hepatitis, followed by [facility] hepatology clinic and maintained on prednisone and imuran daily.   Recent liver labs looked good per his report He awoke this am and noted that he had a spinning sensation.   This caused him to vomit twice.  No abdominal pain- the vomiting seems to be due to the vertigo sensation The spinning will be worse if he moves He is better when holding still He is not sure how long the spinning takes to resolve when he stops moving- he first said 4-5 minutes but then reconsidered, maybe a minute or less He has a minor HA.   No tinnitus or hearing change.   Vision is ok He has not noted a fever.    He just started a new job last week and needs a note.    Patient Active Problem List   Diagnosis Date Noted  . History of systemic steroid therapy 07/10/2014  . Hepatitis, autoimmune 04/22/2012  . Autoimmune hepatitis 12/10/2010    Past Medical History  Diagnosis Date  . Autoimmune hepatitis treated with steroids     History reviewed. No pertinent past surgical history.  History  Substance Use Topics  . Smoking status: Never Smoker   . Smokeless tobacco: Not on file  . Alcohol Use: No    Family History  Problem Relation Age of Onset  . Diabetes Father   . Heart disease Father   . Hypertension Father     Allergies  Allergen Reactions  . Latex Swelling    Redness and swelling.    Medication list has been reviewed and updated.  Current Outpatient Prescriptions on File Prior to Visit  Medication Sig Dispense Refill  . azaTHIOprine (IMURAN) 50 MG tablet   5  .  predniSONE (DELTASONE) 5 MG tablet Take 1 tablet (5 mg total) by mouth daily with breakfast. 90 tablet 0   No current facility-administered medications on file prior to visit.    Review of Systems:  As per HPI- otherwise negative.   Physical Examination: Filed Vitals:   12/02/14 0828  BP: 120/80  Pulse: 60  Temp: 97.8 F (36.6 C)  Resp: 16   Filed Vitals:   12/02/14 0828  Height: 6' (1.829 m)  Weight: 229 lb (103.874 kg)   Body mass index is 31.05 kg/(m^2). Ideal Body Weight: Weight in (lb) to have BMI = 25: 183.9  GEN: WDWN, NAD, Non-toxic, A & O x 3, mild overweight, looks well HEENT: Atraumatic, Normocephalic. Neck supple. No masses, No LAD.  Bilateral TM wnl, oropharynx normal.  PEERL,EOMI.   Ears and Nose: No external deformity. CV: RRR, No M/G/R. No JVD. No thrill. No extra heart sounds. PULM: CTA B, no wheezes, crackles, rhonchi. No retractions. No resp. distress. No accessory muscle use. ABD: S, NT, ND EXTR: No c/c/e NEURO Normal gait. Normal strength, sensation and DTR all extremities.  Negative romberg.  Normal facial movement Positive dix- halpike testing to the left only PSYCH: Normally interactive. Conversant. Not depressed or anxious appearing.  Calm demeanor.   Assessment and Plan:  BPPV (benign paroxysmal positional vertigo), unspecified laterality - Plan: ondansetron (ZOFRAN) 8 MG tablet, meclizine (ANTIVERT) 25 MG tablet  Likely BPPV.  antivert and zofran as needed for sx.  Note for work for today and tomorrow Asked to contact me if not better in the next couple of days- Sooner if worse.   Also if any truly prolonged episodes of vertigo please contact me   Signed Abbe AmsterdamJessica Copland, MD

## 2015-01-01 ENCOUNTER — Ambulatory Visit (INDEPENDENT_AMBULATORY_CARE_PROVIDER_SITE_OTHER): Payer: 59 | Admitting: Family Medicine

## 2015-01-01 VITALS — BP 126/82 | HR 65 | Temp 98.1°F | Resp 15 | Ht 72.0 in | Wt 223.0 lb

## 2015-01-01 DIAGNOSIS — L282 Other prurigo: Secondary | ICD-10-CM | POA: Diagnosis not present

## 2015-01-01 DIAGNOSIS — K754 Autoimmune hepatitis: Secondary | ICD-10-CM

## 2015-01-01 DIAGNOSIS — R21 Rash and other nonspecific skin eruption: Secondary | ICD-10-CM | POA: Diagnosis not present

## 2015-01-01 MED ORDER — AMOXICILLIN-POT CLAVULANATE 875-125 MG PO TABS: 1.0000 | ORAL_TABLET | Freq: Two times a day (BID) | ORAL | Status: DC

## 2015-01-01 MED ORDER — TRIAMCINOLONE ACETONIDE 0.1 % EX CREA: 1.0000 "application " | TOPICAL_CREAM | Freq: Two times a day (BID) | CUTANEOUS | Status: DC

## 2015-01-01 NOTE — Progress Notes (Signed)
Chief Complaint:  Chief Complaint  Patient presents with  . Rash    Back and chest onset 3-4 days/ itches and burns    HPI: Thomas Terrell is a 24 y.o. male who reports to [facility] today complaining of shown his chest and on his back.  This started about 3 days ago. His brother has it as well. They work at the same place. He has been sweating a lot at work.   It's a glass [employer]. Other people have it as well. It looks crusty, is painful, and is itchy. He has had no fevers or chills.  He does have an autoimmune hepatitis and is on Remeron and also prednisone.  Past Medical History  Diagnosis Date  . Autoimmune hepatitis treated with steroids    No past surgical history on file. History   Social History  . Marital Status: Single    Spouse Name: N/A  . Number of Children: N/A  . Years of Education: N/A   Social History Main Topics  . Smoking status: Never Smoker   . Smokeless tobacco: Not on file  . Alcohol Use: No  . Drug Use: No  . Sexual Activity: No   Other Topics Concern  . None   Social History Narrative   Family History  Problem Relation Age of Onset  . Diabetes Father   . Heart disease Father   . Hypertension Father    Allergies  Allergen Reactions  . Latex Swelling    Redness and swelling.   Prior to Admission medications   Medication Sig Start Date End Date Taking? Authorizing Provider  azaTHIOprine (IMURAN) 50 MG tablet  04/08/14  Yes Historical Provider, MD  predniSONE (DELTASONE) 5 MG tablet Take 1 tablet (5 mg total) by mouth daily with breakfast. 07/09/14  Yes Tonye Pearson, MD  amoxicillin-clavulanate (AUGMENTIN) 875-125 MG per tablet Take 1 tablet by mouth 2 (two) times daily. 01/01/15   Damein Gaunce P Earlin Sweeden, DO  triamcinolone cream (KENALOG) 0.1 % Apply 1 application topically 2 (two) times daily. 01/01/15   Daylani Deblois P Alawna Graybeal, DO     ROS: The patient denies fevers, chills, night sweats, unintentional weight loss, chest pain, palpitations,  wheezing, dyspnea on exertion, nausea, vomiting, abdominal pain, dysuria, hematuria, melena, numbness, weakness, or tingling.   All other systems have been reviewed and were otherwise negative with the exception of those mentioned in the HPI and as above.    PHYSICAL EXAM: Filed Vitals:   01/01/15 0840  BP: 126/82  Pulse: 65  Temp: 98.1 F (36.7 C)  Resp: 15   Body mass index is 30.24 kg/(m^2).   General: Alert, no acute distress HEENT:  Normocephalic, atraumatic, oropharynx patent. EOMI, PERRLA Cardiovascular:  Regular rate and rhythm, no rubs murmurs or gallops.  No Carotid bruits, radial pulse intact. No pedal edema.  Respiratory: Clear to auscultation bilaterally.  No wheezes, rales, or rhonchi.  No cyanosis, no use of accessory musculature Abdominal: No organomegaly, abdomen is soft and non-tender, positive bowel sounds. No masses. Skin: Positive impetigo-like maculopapular rash. With crusting and drainage on the chest area. It is crossing the midline so I don't suspect that it is the shingles. It erythematous but without any abscess-like formation. I don not supect it is tinea since not beefy red or rimmed. Please see picture in media section Neurologic: Facial musculature symmetric. Psychiatric: Patient acts appropriately throughout our interaction. Lymphatic: No cervical or submandibular lymphadenopathy Musculoskeletal: Gait intact. No edema, tenderness  LABS: Results for orders placed or performed in visit on 06/06/13  POCT CBC  Result Value Ref Range   WBC 4.7 4.6 - 10.2 K/uL   Lymph, poc 1.2 0.6 - 3.4   POC LYMPH PERCENT 24.5 10 - 50 %L   MID (cbc) 0.5 0 - 0.9   POC MID % 10.3 0 - 12 %M   POC Granulocyte 3.1 2 - 6.9   Granulocyte percent 65.2 37 - 80 %G   RBC 5.06 4.69 - 6.13 M/uL   Hemoglobin 15.9 14.1 - 18.1 g/dL   HCT, POC 16.1 09.6 - 53.7 %   MCV 98.2 (A) 80 - 97 fL   MCH, POC 31.4 (A) 27 - 31.2 pg   MCHC 32.0 31.8 - 35.4 g/dL   RDW, POC 04.5 %   Platelet  Count, POC 149 142 - 424 K/uL   MPV 10.7 0 - 99.8 fL     EKG/XRAY:   Primary read interpreted by Dr. Conley Rolls at [facility].   ASSESSMENT/PLAN: Encounter Diagnoses  Name Primary?  . Rash and nonspecific skin eruption Yes  . Prurigo   . Hepatitis, autoimmune    Rx Augmentin, I suspect this is stap, strep rash , try to avoid abx that is hepatotoxic due to autoimmune hepatitis Rx triamcinolone cream For topical itchiness. He is already on prednisone but it's a very low dose. Fu prn    Gross sideeffects, risk and benefits, and alternatives of medications d/w patient. Patient is aware that all medications have potential sideeffects and we are unable to predict every sideeffect or drug-drug interaction that may occur.  Byrl Latin DO  01/01/2015 9:03 AM

## 2015-05-04 ENCOUNTER — Encounter: Payer: Self-pay | Admitting: Internal Medicine

## 2015-06-17 MED FILL — azaTHIOprine 50 MG TABS: 50 | 90 days supply | Qty: 180 | Fill #0

## 2015-07-16 MED FILL — predniSONE 5 MG TABS: 5 | 90 days supply | Qty: 90 | Fill #0

## 2015-07-30 DIAGNOSIS — K754 Autoimmune hepatitis: Secondary | ICD-10-CM | POA: Diagnosis not present

## 2015-07-30 DIAGNOSIS — Z79899 Other long term (current) drug therapy: Secondary | ICD-10-CM | POA: Diagnosis not present

## 2015-08-10 DIAGNOSIS — K754 Autoimmune hepatitis: Secondary | ICD-10-CM | POA: Diagnosis not present

## 2015-09-07 MED FILL — azaTHIOprine 50 MG TABS: 50 | 90 days supply | Qty: 180 | Fill #0

## 2015-09-10 DIAGNOSIS — K754 Autoimmune hepatitis: Secondary | ICD-10-CM | POA: Diagnosis not present

## 2015-10-12 MED FILL — predniSONE 5 MG TABS: 5 | 90 days supply | Qty: 90 | Fill #1

## 2015-12-09 MED FILL — azaTHIOprine 50 MG TABS: 50 | 90 days supply | Qty: 180 | Fill #1

## 2015-12-18 DIAGNOSIS — Z79899 Other long term (current) drug therapy: Secondary | ICD-10-CM | POA: Diagnosis not present

## 2015-12-18 DIAGNOSIS — K754 Autoimmune hepatitis: Secondary | ICD-10-CM | POA: Diagnosis not present

## 2016-01-01 MED FILL — predniSONE 5 MG TABS: 5 | 90 days supply | Qty: 90 | Fill #2

## 2016-03-01 MED FILL — azaTHIOprine 50 MG TABS: 50 | 90 days supply | Qty: 180 | Fill #2

## 2016-03-05 ENCOUNTER — Telehealth: Payer: 59 | Admitting: Family

## 2016-03-05 DIAGNOSIS — J019 Acute sinusitis, unspecified: Secondary | ICD-10-CM | POA: Diagnosis not present

## 2016-03-05 MED ORDER — AMOXICILLIN-POT CLAVULANATE 875-125 MG PO TABS: 1.0000 | ORAL_TABLET | Freq: Two times a day (BID) | ORAL | 0 refills | Status: DC

## 2016-03-05 NOTE — Progress Notes (Signed)

## 2016-03-10 DIAGNOSIS — Z79899 Other long term (current) drug therapy: Secondary | ICD-10-CM | POA: Diagnosis not present

## 2016-03-10 DIAGNOSIS — K754 Autoimmune hepatitis: Secondary | ICD-10-CM | POA: Diagnosis not present

## 2016-04-18 MED FILL — predniSONE 5 MG TABS: 5 | 90 days supply | Qty: 90 | Fill #3

## 2016-06-07 MED FILL — azaTHIOprine 50 MG TABS: 50 | 90 days supply | Qty: 180 | Fill #3

## 2016-08-30 ENCOUNTER — Encounter: Payer: Self-pay | Admitting: *Deleted

## 2016-08-30 ENCOUNTER — Emergency Department
Admission: EM | Admit: 2016-08-30 | Discharge: 2016-08-30 | Disposition: A | Discharge status: Home / Self Care | Payer: BLUE CROSS/BLUE SHIELD | Source: Home / Self Care | Attending: Family Medicine | Admitting: Family Medicine

## 2016-08-30 DIAGNOSIS — L081 Erythrasma: Secondary | ICD-10-CM

## 2016-08-30 MED ORDER — KETOCONAZOLE 2 % EX CREA: 1.0000 "application " | TOPICAL_CREAM | Freq: Every day | CUTANEOUS | 1 refills | Status: DC

## 2016-08-30 MED ORDER — ERYTHROMYCIN 2 % EX PADS: MEDICATED_PAD | CUTANEOUS | 2 refills | Status: DC

## 2016-08-30 NOTE — ED Triage Notes (Signed)
Pt c/o bilateral foot pain, burning and itching x 2-3 mths. He has applied OTC athlete's foot meds with some relief.

## 2016-08-30 NOTE — Discharge Instructions (Signed)
Wear dry clean socks daily.  Alternate shoes daily to promote drying of shoes.

## 2016-08-30 NOTE — ED Provider Notes (Signed)
[facility]    CSN: [id number] Arrival date & time: 08/30/16  1204     History   Chief Complaint Chief Complaint  Patient presents with  . Foot Pain    HPI Thomas Terrell H Hopes is a 26 y.o. male.   Patient reports that both of his feet began to feel hot and burn at the end of a long work day.  His feet look red at night, and have become more malodorous even though he changes socks every day.   The history is provided by the patient.  Foot Pain  This is a new problem. Episode onset: 3 months ago. The problem occurs daily. The problem has been gradually worsening. The symptoms are aggravated by standing. Nothing relieves the symptoms. Treatments tried: Gold Bond powder and other antifungal creams. The treatment provided no relief.    Past Medical History:  Diagnosis Date  . Autoimmune hepatitis treated with steroids [facility](HCC)     Patient Active Problem List   Diagnosis Date Noted  . History of systemic steroid therapy 07/10/2014  . Hepatitis, autoimmune (HCC) 04/22/2012  . Autoimmune hepatitis (HCC) 12/10/2010    History reviewed. No pertinent surgical history.     Home Medications    Prior to Admission medications   Medication Sig Start Date End Date Taking? Authorizing Provider  azaTHIOprine (IMURAN) 50 MG tablet  04/08/14  Yes Historical Provider, MD  predniSONE (DELTASONE) 5 MG tablet Take 1 tablet (5 mg total) by mouth daily with breakfast. 07/09/14  Yes Tonye Pearsonobert P Doolittle, MD  Erythromycin 2 % PADS Apply to affected areas on feet twice daily for two weeks. 08/30/16   Lattie HawStephen A Jorey Dollard, MD  ketoconazole (NIZORAL) 2 % cream Apply 1 application topically daily. Use for two weeks 08/30/16   Lattie HawStephen A Bleu Minerd, MD    Family History Family History  Problem Relation Age of Onset  . Diabetes Father   . Heart disease Father   . Hypertension Father     Social History Social History  Substance Use Topics  . Smoking status: Never Smoker  . Smokeless tobacco: Never  Used  . Alcohol use No     Allergies   Latex   Review of Systems Review of Systems  All other systems reviewed and are negative.    Physical Exam Triage Vital Signs ED Triage Vitals  Enc Vitals Group     BP 08/30/16 1229 122/77     Pulse Rate 08/30/16 1229 71     Resp 08/30/16 1229 16     Temp 08/30/16 1229 97.5 F (36.4 C)     Temp Source 08/30/16 1229 Oral     SpO2 08/30/16 1229 99 %     Weight 08/30/16 1229 238 lb (108 kg)     Height 08/30/16 1229 6' (1.829 m)     Head Circumference --      Peak Flow --      Pain Score 08/30/16 1230 5     Pain Loc --      Pain Edu? --      Excl. in GC? --    No data found.   Updated Vital Signs BP 122/77 (BP Location: Left Arm)   Pulse 71   Temp 97.5 F (36.4 C) (Oral)   Resp 16   Ht 6' (1.829 m)   Wt 238 lb (108 kg)   SpO2 99%   BMI 32.28 kg/m   Visual Acuity Right Eye Distance:   Left Eye Distance:  Bilateral Distance:    Right Eye Near:   Left Eye Near:    Bilateral Near:     Physical Exam  Constitutional: He appears well-developed and well-nourished. No distress.  HENT:  Head: Normocephalic.  Eyes: Pupils are equal, round, and reactive to light.  Cardiovascular: Normal rate.   Pulmonary/Chest: Effort normal.  Musculoskeletal: He exhibits no edema.       Feet:  Web spaces between toes slightly hyperkeratotic and macerated.  Erythema on dorsa of toes as noted on diagrams without tenderness, swelling, induration, or warmth. The areas glow coral under Wood's lamp.  Neurological: He is alert.  Skin: Skin is warm and dry.  Nursing note and vitals reviewed.    UC Treatments / Results  Labs (all labs ordered are listed, but only abnormal results are displayed) Labs Reviewed - No data to display  EKG  EKG Interpretation None       Radiology No results found.  Procedures Procedures (including critical care time)  Medications Ordered in UC Medications - No data to display   Initial  Impression / Assessment and Plan / UC Course  I have reviewed the triage vital signs and the nursing notes.  Pertinent labs & imaging results that were available during my care of the patient were reviewed by me and considered in my medical decision making (see chart for details).    Begin topical erythromycin 2% gel pads BID for two weeks. Begin empiric ketoconazole 2% cream QD for two weeks. Wear dry clean socks daily.  Alternate shoes daily to promote drying of shoes. Followup with dermatologist if not improved two weeks.    Final Clinical Impressions(s) / UC Diagnoses   Final diagnoses:  Erythrasma    New Prescriptions Discharge Medication List as of 08/30/2016  1:02 PM    START taking these medications   Details  Erythromycin 2 % PADS Apply to affected areas on feet twice daily for two weeks., Print    ketoconazole (NIZORAL) 2 % cream Apply 1 application topically daily. Use for two weeks, Starting Tue 08/30/2016, Print         Lattie Haw, MD 08/30/16 1450

## 2017-12-21 ENCOUNTER — Encounter: Payer: Self-pay | Admitting: Emergency Medicine

## 2017-12-21 ENCOUNTER — Emergency Department
Admission: EM | Admit: 2017-12-21 | Discharge: 2017-12-21 | Disposition: A | Discharge status: Home / Self Care | Payer: 59 | Source: Home / Self Care | Attending: Family Medicine | Admitting: Family Medicine

## 2017-12-21 ENCOUNTER — Other Ambulatory Visit: Payer: Self-pay

## 2017-12-21 DIAGNOSIS — L989 Disorder of the skin and subcutaneous tissue, unspecified: Secondary | ICD-10-CM

## 2017-12-21 DIAGNOSIS — R519 Headache, unspecified: Secondary | ICD-10-CM

## 2017-12-21 DIAGNOSIS — S90862A Insect bite (nonvenomous), left foot, initial encounter: Secondary | ICD-10-CM

## 2017-12-21 DIAGNOSIS — R51 Headache: Secondary | ICD-10-CM | POA: Diagnosis not present

## 2017-12-21 DIAGNOSIS — W57XXXA Bitten or stung by nonvenomous insect and other nonvenomous arthropods, initial encounter: Secondary | ICD-10-CM

## 2017-12-21 LAB — POCT CBC W AUTO DIFF (K'VILLE URGENT CARE)

## 2017-12-21 MED ORDER — DOXYCYCLINE HYCLATE 100 MG PO CAPS: 100.0000 mg | ORAL_CAPSULE | Freq: Two times a day (BID) | ORAL | 0 refills | Status: AC

## 2017-12-21 NOTE — ED Triage Notes (Signed)
Headache, rash between shoulder blades, fatigue, possible bug bite on lower left leg x 10 days. Removed a tick from his foot about 2 weeks ago.  Rash has resolved, bite is not bothering him, headache is 2/10

## 2017-12-21 NOTE — ED Notes (Signed)
Successful blood draw from right AC x1

## 2017-12-21 NOTE — Discharge Instructions (Signed)
°  You may take 500mg  acetaminophen every 4-6 hours or in combination with ibuprofen 400-600mg  every 6-8 hours as needed for pain, inflammation, and fever.  Be sure to drink at least eight 8oz glasses of water to stay well hydrated and get at least 8 hours of sleep at night, preferably more while sick.   Please take the antibiotic as prescribed to treat the skin sore on your Left lower leg and to cover for potential tick illness. The lab results should come back within the next 3-4 days.  You will be notified of the results even if they are all normal. Please follow up with family medicine in 1 week if not improving.

## 2017-12-21 NOTE — ED Provider Notes (Signed)
[facility]    CSN: [id number] Arrival date & time: 12/21/17  1016     History   Chief Complaint Chief Complaint  Patient presents with  . Headache    HPI Thomas Terrell is a 27 y.o. male.   HPI Thomas Terrell is a 27 y.o. male presenting to UC with c/o generalized HA for about 1.5 weeks that was intermittent but has been constant the last 4 days, 2/10 in severity at this time. Mild relief with Tylenol.  No prior hx of migraines or HAs. 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 His wife notes she noticed a rash on his back that has since resolved but pt and wife are concerned symptoms could be due to a tick bite on his Left foot about 2 weeks ago.  He is unsure how long the tick was on his foot but "felt something" so he scratched his foot, then saw the tick crawling on the ground.  Denies fever, chills, n/v/d. Hx of liver disease- autoimmune hepatitis. He has f/u with his specialist in August.    Past Medical History:  Diagnosis Date  . Autoimmune hepatitis treated with steroids [facility])     Patient Active Problem List   Diagnosis Date Noted  . History of systemic steroid therapy 07/10/2014  . Hepatitis, autoimmune (HCC) 04/22/2012  . Autoimmune hepatitis (HCC) 12/10/2010    History reviewed. No pertinent surgical history.     Home Medications    Prior to Admission medications   Medication Sig Start Date End Date Taking? Authorizing Provider  azaTHIOprine (IMURAN) 50 MG tablet  04/08/14   [provider]  doxycycline (VIBRAMYCIN) 100 MG capsule Take 1 capsule (100 mg total) by mouth 2 (two) times daily for 10 days. 12/21/17 12/31/17  Lurene Shadow, PA-C  predniSONE (DELTASONE) 5 MG tablet Take 1 tablet (5 mg total) by mouth daily with breakfast. 07/09/14   Tonye Pearson, MD    Family History Family History  Problem Relation Age of Onset  . Diabetes Father   . Heart disease Father   . Hypertension Father      Social History Social History   Tobacco Use  . Smoking status: Never Smoker  . Smokeless tobacco: Never Used  Substance Use Topics  . Alcohol use: No  . Drug use: No     Allergies   Latex   Review of Systems Review of Systems  Constitutional: Negative for chills, fatigue and fever.  HENT: Negative for congestion, rhinorrhea, sinus pressure, sinus pain and sore throat.   Gastrointestinal: Negative for abdominal pain, diarrhea, nausea and vomiting.  Musculoskeletal: Negative for arthralgias and myalgias.  Skin: Positive for rash and wound.  Neurological: Positive for headaches. Negative for dizziness and light-headedness.     Physical Exam Triage Vital Signs ED Triage Vitals [12/21/17 1050]  Enc Vitals Group     BP 134/83     Pulse Rate 63     Resp      Temp 98.4 F (36.9 C)     Temp Source Oral     SpO2 99 %     Weight 242 lb (109.8 kg)     Height 6' (1.829 m)     Head Circumference      Peak Flow      Pain Score 2     Pain Loc      Pain Edu?      Excl. in GC?  No data found.  Updated Vital Signs BP 134/83 (BP Location: Right Arm)   Pulse 63   Temp 98.4 F (36.9 C) (Oral)   Ht 6' (1.829 m)   Wt 242 lb (109.8 kg)   SpO2 99%   BMI 32.82 kg/m   Visual Acuity Right Eye Distance:   Left Eye Distance:   Bilateral Distance:    Right Eye Near:   Left Eye Near:    Bilateral Near:     Physical Exam  Constitutional: He is oriented to person, place, and time. He appears well-developed and well-nourished.  Non-toxic appearance. He does not appear ill. No distress.  HENT:  Head: Normocephalic and atraumatic.  Right Ear: Tympanic membrane normal.  Left Ear: Tympanic membrane normal.  Nose: Nose normal.  Mouth/Throat: Uvula is midline, oropharynx is clear and moist and mucous membranes are normal.  Eyes: Pupils are equal, round, and reactive to light. EOM are normal.  Neck: Normal range of motion. Neck supple.  Cardiovascular: Normal rate and  regular rhythm.  Pulmonary/Chest: Effort normal and breath sounds normal. No stridor. No respiratory distress. He has no wheezes. He has no rales.  Musculoskeletal: Normal range of motion.  Neurological: He is alert and oriented to person, place, and time. GCS eye subscore is 4. GCS verbal subscore is 5. GCS motor subscore is 6.  Skin: Skin is warm and dry. No rash noted.     Left lower leg: 1cm erythematous pustule. Minimally tender.  Left foot, dorsal lateral aspect: 2mm scab (area of reported tick bite) no surrounding erythema or edema.   Psychiatric: He has a normal mood and affect. His behavior is normal.  Nursing note and vitals reviewed.    UC Treatments / Results  Labs (all labs ordered are listed, but only abnormal results are displayed) Labs Reviewed  COMPLETE METABOLIC PANEL WITH GFR  B. BURGDORFI ANTIBODIES  ROCKY MTN SPOTTED FVR ABS PNL(IGG+IGM)  POCT CBC W AUTO DIFF ([facility])    EKG None  Radiology No results found.  Procedures Procedures (including critical care time)  Medications Ordered in UC Medications - No data to display  Initial Impression / Assessment and Plan / UC Course  I have reviewed the triage vital signs and the nursing notes.  Pertinent labs & imaging results that were available during my care of the patient were reviewed by me and considered in my medical decision making (see chart for details).     Normal neuro exam HA possibly due to mild dehydration given pt's job and high temps 90s-100*F this week. Possible tick borne disease. Will start on Doxycycline while lab tests pending.  Final Clinical Impressions(s) / UC Diagnoses   Final diagnoses:  Generalized headache  Tick bite of left foot, initial encounter  Sore on leg     Discharge Instructions      You may take 500mg  acetaminophen every 4-6 hours or in combination with ibuprofen 400-600mg  every 6-8 hours as needed for pain, inflammation, and fever.  Be sure  to drink at least eight 8oz glasses of water to stay well hydrated and get at least 8 hours of sleep at night, preferably more while sick.   Please take the antibiotic as prescribed to treat the skin sore on your Left lower leg and to cover for potential tick illness. The lab results should come back within the next 3-4 days.  You will be notified of the results even if they are all normal. Please follow up with family  medicine in 1 week if not improving.     ED Prescriptions    Medication Sig Dispense Auth. Provider   doxycycline (VIBRAMYCIN) 100 MG capsule Take 1 capsule (100 mg total) by mouth 2 (two) times daily for 10 days. 20 capsule Lurene Shadow, PA-C     Controlled Substance Prescriptions [place] Controlled Substance Registry consulted? Not Applicable   Rolla Plate 12/21/17 1249

## 2017-12-22 LAB — COMPLETE METABOLIC PANEL WITH GFR
AG Ratio: 1.3 (calc) (ref 1.0–2.5)
ALT: 23 U/L (ref 9–46)
AST: 26 U/L (ref 10–40)
Albumin: 4.1 g/dL (ref 3.6–5.1)
Alkaline phosphatase (APISO): 148 U/L — ABNORMAL HIGH (ref 40–115)
BUN: 10 mg/dL (ref 7–25)
CO2: 26 mmol/L (ref 20–32)
Calcium: 9.1 mg/dL (ref 8.6–10.3)
Chloride: 106 mmol/L (ref 98–110)
Creat: 0.76 mg/dL (ref 0.60–1.35)
GFR, Est African American: 145 mL/min/{1.73_m2} (ref >=60)
GFR, Est Non African American: 125 mL/min/{1.73_m2} (ref >=60)
Globulin: 3.1 g/dL (calc) (ref 1.9–3.7)
Glucose, Bld: 94 mg/dL (ref 65–99)
Potassium: 3.6 mmol/L (ref 3.5–5.3)
Sodium: 139 mmol/L (ref 135–146)
Total Bilirubin: 1.2 mg/dL (ref 0.2–1.2)
Total Protein: 7.2 g/dL (ref 6.1–8.1)

## 2017-12-22 LAB — B. BURGDORFI ANTIBODIES: B burgdorferi Ab IgG+IgM: <0.9 index

## 2017-12-22 LAB — ROCKY MTN SPOTTED FVR ABS PNL(IGG+IGM)
RMSF IgG: NOT DETECTED
RMSF IgM: NOT DETECTED

## 2017-12-23 ENCOUNTER — Telehealth: Payer: Self-pay | Admitting: Emergency Medicine

## 2017-12-23 NOTE — Telephone Encounter (Signed)
Spoke with patient and told him the labs were negative for RMSF and Lyme's; alk was slightly elevated but Dr.Beese states that is due to his hx of hepatitis. 

## 2018-01-22 ENCOUNTER — Encounter: Payer: Self-pay | Admitting: Physician Assistant

## 2018-01-22 ENCOUNTER — Ambulatory Visit: Payer: 59 | Admitting: Physician Assistant

## 2018-01-22 VITALS — BP 114/77 | HR 64 | Ht 70.0 in | Wt 244.0 lb

## 2018-01-22 DIAGNOSIS — Z7689 Persons encountering health services in other specified circumstances: Secondary | ICD-10-CM | POA: Diagnosis not present

## 2018-01-22 DIAGNOSIS — E6609 Other obesity due to excess calories: Secondary | ICD-10-CM

## 2018-01-22 DIAGNOSIS — Z23 Encounter for immunization: Secondary | ICD-10-CM | POA: Diagnosis not present

## 2018-01-22 DIAGNOSIS — Z6835 Body mass index (BMI) 35.0-35.9, adult: Secondary | ICD-10-CM | POA: Diagnosis not present

## 2018-01-22 NOTE — Progress Notes (Signed)
HPI:                                                                Thomas FlemingsBrady H Terrell is a 27 y.o. male [facility] today to establish care  Current concerns: none  He was recently treated in urgent care on 12/21/17 for a tick bite and generalized headache with doxycycline. Reports headaches have resolved. Denies fever, chills, malaise, myalgia/arthralgia or rashes.  Depression screen [facility] 12/02/2014 07/09/2014  Decreased Interest 0 0  Down, Depressed, Hopeless 0 0  PHQ - 2 Score 0 0    No flowsheet data found.    Past Medical History:  Diagnosis Date  . Autoimmune hepatitis treated with steroids (HCC)   . Cholangitis, sclerosing   . 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 LIVER BIOPSY     x 2   Social History   Tobacco Use  . Smoking status: Never Smoker  . Smokeless tobacco: Never Used  Substance Use Topics  . Alcohol use: No   family history includes Breast cancer in his maternal grandmother and paternal grandmother; Diabetes in his father; Heart disease in his father; Hypertension in his father.    ROS: negative except as noted in the HPI  Medications: Current Outpatient Medications  Medication Sig Dispense Refill  . azaTHIOprine (IMURAN) 50 MG tablet   5  . predniSONE (DELTASONE) 10 MG tablet Take 10 mg by mouth daily with breakfast.     No current facility-administered medications for this visit.    Allergies  Allergen Reactions  . Latex Swelling    Redness and swelling.       Objective:  BP 114/77   Pulse 64   Ht 5\' 10"  (1.778 m)   Wt 244 lb (110.7 kg)   BMI 35.01 kg/m  Gen:  alert, not ill-appearing, no distress, appropriate for age, obese male HEENT: head normocephalic without obvious abnormality, conjunctiva and cornea clear, wearing glasses, trachea midline Pulm: Normal work of breathing, normal phonation, clear to  auscultation bilaterally, no wheezes, rales or rhonchi CV: Normal rate, regular rhythm, s1 and s2 distinct, no murmurs, clicks or rubs  Neuro: alert and oriented x 3, no tremor MSK: extremities atraumatic, normal gait and station Skin: intact, no rashes on exposed skin, no jaundice, no cyanosis Psych: well-groomed, cooperative, good eye contact, euthymic mood, affect mood-congruent, speech is articulate, and thought processes clear and goal-directed    No results found for this or any previous visit (from the past 72 hour(s)). No results found.    Assessment and Plan: 27 y.o. male with   .Thomas FoleyBrady was seen today for establish care.  Diagnoses and all orders for this visit:  Encounter to establish care  Class 2 obesity due to excess calories without serious comorbidity with body mass index (BMI) of 35.0 to 35.9 in adult   - Personally reviewed PMH, PSH, PFH, medications, allergies, HM - Age-appropriate cancer screening: n/a - Influenza updated today - Tdap UTD - Pneumovax UTD per patient, booster recommended every 5 years - PHQ2 refused by patient   Patient education and anticipatory guidance given Patient agrees with treatment plan Follow-up for  CPE with fasting labs or sooner as needed  Thomas Hubertharley E. Cummings PA-C

## 2018-05-01 ENCOUNTER — Emergency Department (INDEPENDENT_AMBULATORY_CARE_PROVIDER_SITE_OTHER): Payer: 59

## 2018-05-01 ENCOUNTER — Emergency Department (INDEPENDENT_AMBULATORY_CARE_PROVIDER_SITE_OTHER)
Admission: EM | Admit: 2018-05-01 | Discharge: 2018-05-01 | Disposition: A | Discharge status: Home / Self Care | Payer: 59 | Source: Home / Self Care | Attending: Family Medicine | Admitting: Family Medicine

## 2018-05-01 ENCOUNTER — Other Ambulatory Visit: Payer: Self-pay

## 2018-05-01 DIAGNOSIS — R0989 Other specified symptoms and signs involving the circulatory and respiratory systems: Secondary | ICD-10-CM

## 2018-05-01 DIAGNOSIS — J069 Acute upper respiratory infection, unspecified: Secondary | ICD-10-CM | POA: Diagnosis not present

## 2018-05-01 DIAGNOSIS — B9789 Other viral agents as the cause of diseases classified elsewhere: Secondary | ICD-10-CM | POA: Diagnosis not present

## 2018-05-01 DIAGNOSIS — R05 Cough: Secondary | ICD-10-CM

## 2018-05-01 MED ORDER — BENZONATATE 100 MG PO CAPS: 100.0000 mg | ORAL_CAPSULE | Freq: Three times a day (TID) | ORAL | 0 refills | Status: DC

## 2018-05-01 NOTE — ED Triage Notes (Signed)
Thursday through Sunday, fatigue, cough, generally not feeling well.  Productive cough, thick and brown.

## 2018-05-01 NOTE — Discharge Instructions (Signed)
You may take 500mg  acetaminophen every 4-6 hours or in combination with ibuprofen 400-600mg  every 6-8 hours as needed for pain, inflammation, and fever.  Be sure to stay well hydrated and get at least 8 hours of sleep at night, preferably more while sick.   You may take over the counter mucinex or delsym to help with cough/congestion.  Please follow up in 1 week with family medicine if not improving.

## 2018-05-01 NOTE — ED Provider Notes (Signed)
[facility]    CSN: [id number] Arrival date & time: 05/01/18  1130     History   Chief Complaint Chief Complaint  Patient presents with  . Cough    HPI Thomas Terrell is a 27 y.o. male.   HPI Thomas Terrell is a 27 y.o. male presenting to UC with c/o mildly productive cough that started 6 days ago, associated fatigue and generally not feeling well.  His cough has gradually worsened, producing a thick brown discharge. Hx of pneumonia about 5 years ago and two other times prior to that. He has not needed to be hospitalized for pneumonia in the past. Denies hx of asthma. Denies chest pain or SOB.   Past Medical History:  Diagnosis Date  . Autoimmune hepatitis treated with steroids (HCC)   . Cholangitis, sclerosing   . Cirrhosis of liver not due to alcohol [facility](HCC)     Patient Active Problem List   Diagnosis Date Noted  . Class 2 obesity due to excess calories without serious comorbidity with body mass index (BMI) of 35.0 to 35.9 in adult 01/22/2018  . History of systemic steroid therapy 07/10/2014  . Autoimmune hepatitis (HCC) 12/10/2010    Past Surgical History:  Procedure Laterality Date  . LIVER BIOPSY     x 2       Home Medications    Prior to Admission medications   Medication Sig Start Date End Date Taking? Authorizing Provider  azaTHIOprine (IMURAN) 50 MG tablet  04/08/14   [provider]  benzonatate (TESSALON) 100 MG capsule Take 1-2 capsules (100-200 mg total) by mouth every 8 (eight) hours. 05/01/18   Lurene ShadowPhelps, Blaize Nipper O, PA-C  predniSONE (DELTASONE) 10 MG tablet Take 10 mg by mouth daily with breakfast.    [provider]    Family History Family History  Problem Relation Age of Onset  . Diabetes Father   . Heart disease Father   . Hypertension Father   . Breast cancer Maternal Grandmother   . Breast cancer Paternal Grandmother   . Colon cancer Neg Hx   . Prostate cancer Neg Hx     Social History Social History    Tobacco Use  . Smoking status: Never Smoker  . Smokeless tobacco: Never Used  Substance Use Topics  . Alcohol use: No  . Drug use: No     Allergies   Latex   Review of Systems Review of Systems  Constitutional: Positive for fatigue and fever (low grade- 99.8*). Negative for chills.  HENT: Positive for congestion. Negative for ear pain, sore throat, trouble swallowing and voice change.   Respiratory: Positive for cough. Negative for shortness of breath.   Cardiovascular: Negative for chest pain and palpitations.  Gastrointestinal: Negative for abdominal pain, diarrhea, nausea and vomiting.  Musculoskeletal: Negative for arthralgias, back pain and myalgias.  Skin: Negative for rash.  Neurological: Positive for headaches. Negative for dizziness and light-headedness.     Physical Exam Triage Vital Signs ED Triage Vitals  Enc Vitals Group     BP 05/01/18 1144 126/81     Pulse Rate 05/01/18 1144 69     Resp 05/01/18 1144 18     Temp 05/01/18 1144 98.1 F (36.7 C)     Temp Source 05/01/18 1144 Oral     SpO2 05/01/18 1144 98 %     Weight 05/01/18 1145 252 lb (114.3 kg)     Height 05/01/18 1145 6' (1.829 m)     Head Circumference --  Peak Flow --      Pain Score 05/01/18 1145 3     Pain Loc --      Pain Edu? --      Excl. in GC? --    No data found.  Updated Vital Signs BP 126/81 (BP Location: Right Arm)   Pulse 69   Temp 98.1 F (36.7 C) (Oral)   Resp 18   Ht 6' (1.829 m)   Wt 252 lb (114.3 kg)   SpO2 98%   BMI 34.18 kg/m   Visual Acuity Right Eye Distance:   Left Eye Distance:   Bilateral Distance:    Right Eye Near:   Left Eye Near:    Bilateral Near:     Physical Exam  Constitutional: He is oriented to person, place, and time. He appears well-developed and well-nourished.  HENT:  Head: Normocephalic and atraumatic.  Right Ear: Tympanic membrane normal.  Left Ear: Tympanic membrane normal.  Nose: Nose normal.  Mouth/Throat: Uvula is  midline, oropharynx is clear and moist and mucous membranes are normal.  Eyes: EOM are normal.  Neck: Normal range of motion. Neck supple.  Cardiovascular: Normal rate and regular rhythm.  Pulmonary/Chest: Effort normal and breath sounds normal. No stridor. No respiratory distress. He has no wheezes. He has no rales.  Musculoskeletal: Normal range of motion.  Neurological: He is alert and oriented to person, place, and time.  Skin: Skin is warm and dry.  Psychiatric: He has a normal mood and affect. His behavior is normal.  Nursing note and vitals reviewed.    UC Treatments / Results  Labs (all labs ordered are listed, but only abnormal results are displayed) Labs Reviewed - No data to display  EKG None  Radiology Dg Chest 2 View  Result Date: 05/01/2018 CLINICAL DATA:  Cough and chest congestion for the past week. History of nonalcoholic cirrhosis, although immune hepatitis with steroid treatment. Nonsmoker. EXAM: CHEST - 2 VIEW COMPARISON:  PA and lateral chest x-ray of June 18, 2007 FINDINGS: The lungs are adequately inflated and clear. The heart and pulmonary vascularity are normal. The mediastinum is normal in width. There is no pleural effusion. The bony thorax exhibits no acute abnormality. IMPRESSION: There is no active cardiopulmonary disease. Electronically Signed   By: David  [place] M.D.   On: 05/01/2018 12:02    Procedures Procedures (including critical care time)  Medications Ordered in UC Medications - No data to display  Initial Impression / Assessment and Plan / UC Course  I have reviewed the triage vital signs and the nursing notes.  Pertinent labs & imaging results that were available during my care of the patient were reviewed by me and considered in my medical decision making (see chart for details).     CXR: no acute findings Encouraged symptomatic treatment for viral illness Home care info provided.  Final Clinical Impressions(s) / UC Diagnoses    Final diagnoses:  Viral URI with cough     Discharge Instructions     You may take 500mg  acetaminophen every 4-6 hours or in combination with ibuprofen 400-600mg  every 6-8 hours as needed for pain, inflammation, and fever.  Be sure to stay well hydrated and get at least 8 hours of sleep at night, preferably more while sick.   You may take over the counter mucinex or delsym to help with cough/congestion.  Please follow up in 1 week with family medicine if not improving.      ED Prescriptions  Medication Sig Dispense Auth. Provider   benzonatate (TESSALON) 100 MG capsule Take 1-2 capsules (100-200 mg total) by mouth every 8 (eight) hours. 21 capsule Lurene Shadow, PA-C     Controlled Substance Prescriptions [place] Controlled Substance Registry consulted? Not Applicable   Rolla Plate 05/01/18 1402

## 2018-06-25 ENCOUNTER — Encounter: Payer: Self-pay | Admitting: Physician Assistant

## 2018-06-25 ENCOUNTER — Ambulatory Visit (INDEPENDENT_AMBULATORY_CARE_PROVIDER_SITE_OTHER): Payer: 59

## 2018-06-25 ENCOUNTER — Ambulatory Visit (INDEPENDENT_AMBULATORY_CARE_PROVIDER_SITE_OTHER): Payer: 59 | Admitting: Physician Assistant

## 2018-06-25 VITALS — BP 138/88 | HR 87 | Temp 98.1°F | Wt 245.0 lb

## 2018-06-25 DIAGNOSIS — Z8701 Personal history of pneumonia (recurrent): Secondary | ICD-10-CM

## 2018-06-25 DIAGNOSIS — J22 Unspecified acute lower respiratory infection: Secondary | ICD-10-CM

## 2018-06-25 DIAGNOSIS — R0609 Other forms of dyspnea: Secondary | ICD-10-CM | POA: Diagnosis not present

## 2018-06-25 MED ORDER — CEFDINIR 300 MG PO CAPS: 300.0000 mg | ORAL_CAPSULE | Freq: Two times a day (BID) | ORAL | 0 refills | Status: DC

## 2018-06-25 MED ORDER — PREDNISONE 50 MG PO TABS: ORAL_TABLET | ORAL | 0 refills | Status: DC

## 2018-06-25 NOTE — Patient Instructions (Signed)
Community-Acquired Pneumonia, Adult °Pneumonia is an infection of the lungs. There are different types of pneumonia. One type can develop while a person is in a hospital. A different type, called community-acquired pneumonia, develops in people who are not, or have not recently been, in the hospital or other health care facility. °What are the causes? ° °Pneumonia may be caused by bacteria, viruses, or funguses. Community-acquired pneumonia is often caused by Streptococcus pneumonia bacteria. These bacteria are often passed from one person to another by breathing in droplets from the cough or sneeze of an infected person. °What increases the risk? °The condition is more likely to develop in: °· People who have chronic diseases, such as chronic obstructive pulmonary disease (COPD), asthma, congestive heart failure, cystic fibrosis, diabetes, or kidney disease. °· People who have early-stage or late-stage HIV. °· People who have sickle cell disease. °· People who have had their spleen removed (splenectomy). °· People who have poor dental hygiene. °· People who have medical conditions that increase the risk of breathing in (aspirating) secretions their own mouth and nose. °· People who have a weakened immune system (immunocompromised). °· People who smoke. °· People who travel to areas where pneumonia-causing germs commonly exist. °· People who are around animal habitats or animals that have pneumonia-causing germs, including birds, bats, rabbits, cats, and farm animals. °What are the signs or symptoms? °Symptoms of this condition include: °· A dry cough. °· A wet (productive) cough. °· Fever. °· Sweating. °· Chest pain, especially when breathing deeply or coughing. °· Rapid breathing or difficulty breathing. °· Shortness of breath. °· Shaking chills. °· Fatigue. °· Muscle aches. °How is this diagnosed? °Your health care provider will take a medical history and perform a physical exam. You may also have other tests,  including: °· Imaging studies of your chest, including X-rays. °· Tests to check your blood oxygen level and other blood gases. °· Other tests on blood, mucus (sputum), fluid around your lungs (pleural fluid), and urine. °If your pneumonia is severe, other tests may be done to identify the specific cause of your illness. °How is this treated? °The type of treatment that you receive depends on many factors, such as the cause of your pneumonia, the medicines you take, and other medical conditions that you have. For most adults, treatment and recovery from pneumonia may occur at home. In some cases, treatment must happen in a hospital. Treatment may include: °· Antibiotic medicines, if the pneumonia was caused by bacteria. °· Antiviral medicines, if the pneumonia was caused by a virus. °· Medicines that are given by mouth or through an IV tube. °· Oxygen. °· Respiratory therapy. °Although rare, treating severe pneumonia may include: °· Mechanical ventilation. This is done if you are not breathing well on your own and you cannot maintain a safe blood oxygen level. °· Thoracentesis. This procedure removes fluid around one lung or both lungs to help you breathe better. °Follow these instructions at home: ° °· Take over-the-counter and prescription medicines only as told by your health care provider. °? Only take cough medicine if you are losing sleep. Understand that cough medicine can prevent your body’s natural ability to remove mucus from your lungs. °? If you were prescribed an antibiotic medicine, take it as told by your health care provider. Do not stop taking the antibiotic even if you start to feel better. °· Sleep in a semi-upright position at night. Try sleeping in a reclining chair, or place a few pillows under your head. °· Do not use tobacco products, including cigarettes, chewing tobacco, and e-cigarettes.   If you need help quitting, ask your health care provider. °· Drink enough water to keep your urine  clear or pale yellow. This will help to thin out mucus secretions in your lungs. °How is this prevented? °There are ways that you can decrease your risk of developing community-acquired pneumonia. Consider getting a pneumococcal vaccine if: °· You are older than 28 years of age. °· You are older than 28 years of age and are undergoing cancer treatment, have chronic lung disease, or have other medical conditions that affect your immune system. Ask your health care provider if this applies to you. °There are different types and schedules of pneumococcal vaccines. Ask your health care provider which vaccination option is best for you. °You may also prevent community-acquired pneumonia if you take these actions: °· Get an influenza vaccine every year. Ask your health care provider which type of influenza vaccine is best for you. °· Go to the dentist on a regular basis. °· Wash your hands often. Use hand sanitizer if soap and water are not available. °Contact a health care provider if: °· You have a fever. °· You are losing sleep because you cannot control your cough with cough medicine. °Get help right away if: °· You have worsening shortness of breath. °· You have increased chest pain. °· Your sickness becomes worse, especially if you are an older adult or have a weakened immune system. °· You cough up blood. °This information is not intended to replace advice given to you by your health care provider. Make sure you discuss any questions you have with your health care provider. °Document Released: 05/23/2005 Document Revised: 02/09/2017 Document Reviewed: 09/17/2014 °Elsevier Interactive Patient Education © 2019 [employer] ° °

## 2018-06-25 NOTE — Progress Notes (Signed)
HPI:                                                                Thomas Terrell is a 28 y.o. male [facility] today for cough  Cough  This is a new problem. The current episode started 1 to 4 weeks ago (x 9 days). The problem has been gradually worsening. The problem occurs every few minutes. The cough is productive of blood-tinged sputum and productive of sputum. Associated symptoms include chest pain, a fever (tactile), nasal congestion and shortness of breath. Nothing aggravates the symptoms. He has tried OTC cough suppressant (Mucinex) for the symptoms. His past medical history is significant for pneumonia.     Past Medical History:  Diagnosis Date  . Autoimmune hepatitis treated with steroids (HCC)   . Cholangitis, sclerosing   . Cirrhosis of liver not due to alcohol [facility](HCC)    Past Surgical History:  Procedure Laterality Date  . LIVER BIOPSY     x 2   Social History   Tobacco Use  . Smoking status: Never Smoker  . Smokeless tobacco: Never Used  Substance Use Topics  . Alcohol use: No   family history includes Breast cancer in his maternal grandmother and paternal grandmother; Diabetes in his father; Heart disease in his father; Hypertension in his father.    ROS: Review of Systems  Constitutional: Positive for diaphoresis, fever (tactile) and malaise/fatigue.  HENT: Positive for congestion.   Respiratory: Positive for cough, sputum production and shortness of breath.   Cardiovascular: Positive for chest pain.  All other systems reviewed and are negative.    Medications: No current outpatient medications on file.   No current facility-administered medications for this visit.    Allergies  Allergen Reactions  . Latex Swelling    Redness and swelling.       Objective:  BP 138/88   Pulse 87   Temp 98.1 F (36.7 C) (Oral)   Wt 245 lb (111.1 kg)   SpO2 97%   BMI 33.23 kg/m  Gen:   alert, not ill-appearing, no distress, appropriate for age HEENT: head normocephalic without obvious abnormality, conjunctiva and cornea clear, wearing glasses, nasal mucosa pink, no sinus tenderness, oropharynx clear, uvula midline, neck supple, no cervical adenopathy, trachea midline Pulm: Normal work of breathing, normal phonation, breath sounds coarse with end-expiratory wheeze CV: Normal rate, regular rhythm, s1 and s2 distinct, no murmurs, clicks or rubs  Neuro: alert and oriented x 3, no tremor MSK: extremities atraumatic, normal gait and station Skin: intact, diaphoretic, no rashes on exposed skin, no jaundice, no cyanosis     No results found for this or any previous visit (from the past 72 hour(s)). No results found.    Assessment and Plan: 28 y.o. male with   .Thomas Terrell was seen today for cough.  Diagnoses and all orders for this visit:  Acute lower respiratory infection -     DG Chest 2 View  History of pneumonia -     DG Chest 2 View  Other orders -     predniSONE (DELTASONE) 50 MG tablet; One tab PO daily for 5 days. -     cefdinir (OMNICEF) 300 MG capsule; Take 1 capsule (  300 mg total) by mouth 2 (two) times daily.    Afebrile, no tachypnea, no tachycardia Pulse ox mildly reduced at 97% on room air, breath sounds are coarse Given patient's history of pneumonia, dyspnea on exertion and blood-tinged sputum we will treat empirically for community-acquired pneumonia Prednisone burst for dyspnea CXR pending to assess for infiltrate  Patient education and anticipatory guidance given Patient agrees with treatment plan Follow-up as needed if symptoms worsen or fail to improve  Levonne Hubertharley E. Cummings PA-C

## 2019-01-14 ENCOUNTER — Telehealth (INDEPENDENT_AMBULATORY_CARE_PROVIDER_SITE_OTHER): Payer: 59 | Admitting: Physician Assistant

## 2019-01-14 DIAGNOSIS — U071 COVID-19: Secondary | ICD-10-CM

## 2019-01-14 NOTE — Telephone Encounter (Signed)
Patient presented requesting COVID-19 testing today Patient was diagnosed with COVID-19 at [facility] with positive PCR on 01/01/19 Symptom onset was 12/28/18 Reports his symptoms have resolved and he has not had a cough or fever in over 1 week Employer is requiring a negative test Patient is unable to be re-tested at [facility] and unable to drive to [facility] testing site  Patient self-swabbed under my observation in the parking lot today Patient informed that test result could take 3-16 days   _______________________________________________ Nelson Chimes, MMS, PA-C [place] License # [id number]  [place] [facility] [place] [address], [place], [place] [address]

## 2019-01-14 NOTE — Telephone Encounter (Signed)
Done

## 2019-01-16 ENCOUNTER — Encounter: Payer: Self-pay | Admitting: Physician Assistant

## 2019-01-16 LAB — SPECIMEN STATUS REPORT

## 2019-01-16 LAB — NOVEL CORONAVIRUS, NAA: SARS-CoV-2, NAA: NOT DETECTED

## 2020-02-11 ENCOUNTER — Emergency Department (INDEPENDENT_AMBULATORY_CARE_PROVIDER_SITE_OTHER)
Admission: EM | Admit: 2020-02-11 | Discharge: 2020-02-11 | Disposition: A | Discharge status: Home / Self Care | Payer: 59 | Source: Home / Self Care | Attending: Family Medicine | Admitting: Family Medicine

## 2020-02-11 ENCOUNTER — Other Ambulatory Visit: Payer: Self-pay

## 2020-02-11 DIAGNOSIS — J069 Acute upper respiratory infection, unspecified: Secondary | ICD-10-CM | POA: Diagnosis not present

## 2020-02-11 MED ORDER — DOXYCYCLINE HYCLATE 100 MG PO CAPS: ORAL_CAPSULE | ORAL | 0 refills | Status: DC

## 2020-02-11 NOTE — ED Triage Notes (Signed)
Pt c/o runny nose since Sunday. Cough started this am, thinks due to drainage. Denies fever. No known covid exposure. Hx of seasonal allergies. Taking zyrtec prn.  Has not had covid vaccinations.

## 2020-02-11 NOTE — ED Provider Notes (Signed)
[facility]    CSN: [id number] Arrival date & time: 02/11/20  1209      History   Chief Complaint Chief Complaint  Patient presents with  . Cough  . Sinus Issues    HPI Thomas Terrell is a 29 y.o. male.   Patient complains of nasal congestion for two days, with onset of cough this morning.  He denies fever, shortness of breath, and pleuritic pain.  He denies chest tightness and changes in taste/smell.  He has not had COVID19 vaccinations.  The history is provided by the patient.    Past Medical History:  Diagnosis Date  . Autoimmune hepatitis treated with steroids (HCC)   . Cholangitis, sclerosing   . 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 Class 2 obesity due to excess calories without serious comorbidity with body mass index (BMI) of 35.0 to 35.9 in adult 01/22/2018  . History of systemic steroid therapy 07/10/2014  . Autoimmune hepatitis (HCC) 12/10/2010    Past Surgical History:  Procedure Laterality Date  . LIVER BIOPSY     x 2       Home Medications    Prior to Admission medications   Medication Sig Start Date End Date Taking? Authorizing Provider  cefdinir (OMNICEF) 300 MG capsule Take 1 capsule (300 mg total) by mouth 2 (two) times daily. 06/25/18   Carlis Stable, PA-C  doxycycline (VIBRAMYCIN) 100 MG capsule Take one cap PO Q12hr with food. 02/11/20   Lattie Haw, MD  predniSONE (DELTASONE) 50 MG tablet One tab PO daily for 5 days. 06/25/18   Carlis Stable, PA-C    Family History Family History  Problem Relation Age of Onset  . Diabetes Father   . Heart disease Father   . Hypertension Father   . Breast cancer Maternal Grandmother   . Breast cancer Paternal Grandmother   . Colon cancer Neg Hx   . Prostate cancer Neg Hx     Social History Social History   Tobacco Use  . Smoking status: Never Smoker  . Smokeless tobacco: Never Used    Vaping Use  . Vaping Use: Never used  Substance Use Topics  . Alcohol use: No  . Drug use: No     Allergies   Latex   Review of Systems Review of Systems + sore throat + cough No pleuritic pain No wheezing + nasal congestion + post-nasal drainage No sinus pain/pressure No itchy/red eyes ? earache No hemoptysis No SOB No fever, + chills No nausea No vomiting No abdominal pain No diarrhea No urinary symptoms No skin rash + fatigue No myalgias + headache Used OTC meds without relief   Physical Exam Triage Vital Signs ED Triage Vitals  Enc Vitals Group     BP 02/11/20 1307 (!) 151/92     Pulse Rate 02/11/20 1307 88     Resp 02/11/20 1307 18     Temp 02/11/20 1307 98.2 F (36.8 C)     Temp Source 02/11/20 1307 Oral     SpO2 02/11/20 1307 99 %     Weight 02/11/20 1308 270 lb (122.5 kg)     Height 02/11/20 1308 6' (1.829 m)     Head Circumference --      Peak Flow --      Pain Score 02/11/20 1308 0     Pain Loc --  Pain Edu? --      Excl. in GC? --    No data found.  Updated Vital Signs BP (!) 151/92 (BP Location: Left Arm)   Pulse 88   Temp 98.2 F (36.8 C) (Oral)   Resp 18   Ht 6' (1.829 m)   Wt 122.5 kg   SpO2 99%   BMI 36.62 kg/m   Visual Acuity Right Eye Distance:   Left Eye Distance:   Bilateral Distance:    Right Eye Near:   Left Eye Near:    Bilateral Near:     Physical Exam Nursing notes and Vital Signs reviewed. Appearance:  Patient appears stated age, and in no acute distress Eyes:  Pupils are equal, round, and reactive to light and accomodation.  Extraocular movement is intact.  Conjunctivae are not inflamed  Ears:  Canals normal.  Tympanic membranes normal.  Nose:  Mildly congested turbinates.  No sinus tenderness.  Pharynx:  Normal Neck:  Supple.  Note shotty nontender lateral nodes.  Lungs:  Clear to auscultation.  Breath sounds are equal.  Moving air well. Heart:  Regular rate and rhythm without murmurs, rubs, or  gallops.  Abdomen:  Nontender without masses or hepatosplenomegaly.  Bowel sounds are present.  No CVA or flank tenderness.  Extremities:  No edema.  Skin:  No rash present.   UC Treatments / Results  Labs (all labs ordered are listed, but only abnormal results are displayed) Labs Reviewed - No data to display  EKG   Radiology No results found.  Procedures Procedures (including critical care time)  Medications Ordered in UC Medications - No data to display  Initial Impression / Assessment and Plan / UC Course  I have reviewed the triage vital signs and the nursing notes.  Pertinent labs & imaging results that were available during my care of the patient were reviewed by me and considered in my medical decision making (see chart for details).    Note past history of pneumonia, and autoimmune hepatitis with chronic use of prednisone. Begin empiric doxycycline. Followup with Family Doctor if not improved in one week.  Final Clinical Impressions(s) / UC Diagnoses   Final diagnoses:  Viral URI with cough     Discharge Instructions     Take plain guaifenesin (1200mg  extended release tabs such as Mucinex) twice daily, with plenty of water, for cough and congestion.  May add Pseudoephedrine (30mg , one or two every 4 to 6 hours) for sinus congestion.  Get adequate rest.   May use Afrin nasal spray (or generic oxymetazoline) each morning for about 5 days and then discontinue.  Also recommend using saline nasal spray several times daily and saline nasal irrigation (AYR is a common brand).  Use Flonase nasal spray each morning after using Afrin nasal spray and saline nasal irrigation. Try warm salt water gargles for sore throat.  Stop all antihistamines (Zyrtec, etc) for now, and other non-prescription cough/cold preparations. May take Delsym Cough Suppressant ("12 Hour Cough Relief") at bedtime for nighttime cough.         ED Prescriptions    Medication Sig Dispense Auth.  Provider   doxycycline (VIBRAMYCIN) 100 MG capsule Take one cap PO Q12hr with food. 14 capsule , MD        , MD 02/15/20 [phone]

## 2020-02-11 NOTE — Discharge Instructions (Addendum)
Take plain guaifenesin (1200mg  extended release tabs such as Mucinex) twice daily, with plenty of water, for cough and congestion.  May add Pseudoephedrine (30mg , one or two every 4 to 6 hours) for sinus congestion.  Get adequate rest.   May use Afrin nasal spray (or generic oxymetazoline) each morning for about 5 days and then discontinue.  Also recommend using saline nasal spray several times daily and saline nasal irrigation (AYR is a common brand).  Use Flonase nasal spray each morning after using Afrin nasal spray and saline nasal irrigation. Try warm salt water gargles for sore throat.  Stop all antihistamines (Zyrtec, etc) for now, and other non-prescription cough/cold preparations. May take Delsym Cough Suppressant ("12 Hour Cough Relief") at bedtime for nighttime cough.

## 2020-07-07 ENCOUNTER — Other Ambulatory Visit: Payer: 59

## 2020-07-07 DIAGNOSIS — Z20822 Contact with and (suspected) exposure to covid-19: Secondary | ICD-10-CM

## 2020-07-08 ENCOUNTER — Emergency Department (INDEPENDENT_AMBULATORY_CARE_PROVIDER_SITE_OTHER)
Admission: EM | Admit: 2020-07-08 | Discharge: 2020-07-08 | Disposition: A | Discharge status: Home / Self Care | Payer: 59 | Source: Home / Self Care

## 2020-07-08 ENCOUNTER — Other Ambulatory Visit: Payer: Self-pay

## 2020-07-08 DIAGNOSIS — J0100 Acute maxillary sinusitis, unspecified: Secondary | ICD-10-CM

## 2020-07-08 DIAGNOSIS — J22 Unspecified acute lower respiratory infection: Secondary | ICD-10-CM

## 2020-07-08 LAB — POC SARS CORONAVIRUS 2 AG -  ED: SARS Coronavirus 2 Ag: NEGATIVE

## 2020-07-08 MED ORDER — PREDNISONE 20 MG PO TABS: 20.0000 mg | ORAL_TABLET | Freq: Every day | ORAL | 0 refills | Status: AC

## 2020-07-08 MED ORDER — CEFDINIR 300 MG PO CAPS: 300.0000 mg | ORAL_CAPSULE | Freq: Two times a day (BID) | ORAL | 0 refills | Status: DC

## 2020-07-08 NOTE — ED Triage Notes (Signed)
Pt presents to Urgent Care with c/o HA and nasal congestion (w/ yellow-green mucus) x 5 days. Pt w/ no known COVID exposure; has not been vaccinated.

## 2020-07-08 NOTE — Discharge Instructions (Addendum)
Take medication with food and complete entire course of treatment.  You can also take an over-the-counter antihistamine such as Zyrtec or Claritin to help with nasal symptoms.

## 2020-07-08 NOTE — ED Provider Notes (Signed)
[facility]    CSN: [id number] Arrival date & time: 07/08/20  1116      History   Chief Complaint Chief Complaint  Patient presents with  . Headache  . Nasal Congestion    HPI Thomas Terrell is a 30 y.o. male.   HPI Patient presents today with approximately 5 days of nasal congestion, facial pressure, intermittent cough which has worsened over the last 24 hours.  He went and had a Covid test done yesterday has not gotten the results back.  He is unaware if he had if he had any fever.  He denies any shortness of breath.  He works in [employer] and reports he started noticing the congestion after he had torn down the wall without wearing a mask.  He denies knowledge of being around anyone who is recently been sick.  He has been taking Mucinex for symptom management.  Has not taken any Tylenol or ibuprofen today.  He endorses drinking an abundance of fluids however today has drank mostly caffeine with coffee and Coke prior to arrival here in clinic today.  He denies any shortness of breath, chest pain, generalized weakness.  Past Medical History:  Diagnosis Date  . Autoimmune hepatitis treated with steroids (HCC)   . Cholangitis, sclerosing   . 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 Class 2 obesity due to excess calories without serious comorbidity with body mass index (BMI) of 35.0 to 35.9 in adult 01/22/2018  . History of systemic steroid therapy 07/10/2014  . Autoimmune hepatitis (HCC) 12/10/2010    Past Surgical History:  Procedure Laterality Date  . LIVER BIOPSY     x 2       Home Medications    Prior to Admission medications   Medication Sig Start Date End Date Taking? Authorizing Provider  azaTHIOprine (IMURAN) 50 MG tablet Take 50 mg by mouth daily. 3 tabs daily   Yes [provider]  predniSONE (DELTASONE) 5 MG tablet Take 4 mg by mouth daily with breakfast.   Yes [provider]  cefdinir (OMNICEF) 300 MG capsule Take 1 capsule (300 mg total) by mouth 2 (two) times daily. 06/25/18   Carlis Stable, PA-C  doxycycline (VIBRAMYCIN) 100 MG capsule Take one cap PO Q12hr with food. 02/11/20   Lattie Haw, MD  predniSONE (DELTASONE) 50 MG tablet One tab PO daily for 5 days. 06/25/18   Carlis Stable, PA-C    Family History Family History  Problem Relation Age of Onset  . Diabetes Father   . Heart disease Father   . Hypertension Father   . Breast cancer Maternal Grandmother   . Breast cancer Paternal Grandmother   . Colon cancer Neg Hx   . Prostate cancer Neg Hx     Social History Social History   Tobacco Use  . Smoking status: Never Smoker  . Smokeless tobacco: Never Used  Vaping Use  . Vaping Use: Never used  Substance Use Topics  . Alcohol use: No  . Drug use: No     Allergies   Latex   Review of Systems Review of Systems Pertinent negatives listed in HPI Physical Exam Triage Vital Signs ED Triage Vitals  Enc Vitals Group     BP 07/08/20 1147 (!) 161/92     Pulse Rate 07/08/20 1147 (!) 114     Resp 07/08/20 1147 20  Temp 07/08/20 1147 98.2 F (36.8 C)     Temp Source 07/08/20 1147 Oral     SpO2 07/08/20 1147 99 %     Weight 07/08/20 1143 265 lb (120.2 kg)     Height 07/08/20 1143 6' (1.829 m)     Head Circumference --      Peak Flow --      Pain Score 07/08/20 1143 1     Pain Loc --      Pain Edu? --      Excl. in GC? --    No data found.  Updated Vital Signs BP (!) 161/92 (BP Location: Right Arm)   Pulse (!) 114   Temp 98.2 F (36.8 C) (Oral)   Resp 20   Ht 6' (1.829 m)   Wt 265 lb (120.2 kg)   SpO2 99%   BMI 35.94 kg/m   Visual Acuity Right Eye Distance:   Left Eye Distance:   Bilateral Distance:    Right Eye Near:   Left Eye Near:    Bilateral Near:     Physical Exam Constitutional:      Appearance: He is obese. He is ill-appearing.  HENT:     Head: Normocephalic.      Nose: Mucosal edema and congestion present.     Right Turbinates: Enlarged.     Left Turbinates: Enlarged.     Right Sinus: Maxillary sinus tenderness present.  Cardiovascular:     Rate and Rhythm: Normal rate and regular rhythm.     Heart sounds: Normal heart sounds.  Pulmonary:     Effort: Pulmonary effort is normal.     Breath sounds: Normal breath sounds.  Skin:    General: Skin is warm.  Neurological:     GCS: GCS eye subscore is 4. GCS verbal subscore is 5. GCS motor subscore is 6.  Psychiatric:        Mood and Affect: Mood normal.        Speech: Speech normal.        Behavior: Behavior normal.      UC Treatments / Results  Labs (all labs ordered are listed, but only abnormal results are displayed) Labs Reviewed  POC SARS CORONAVIRUS 2 AG -  ED    EKG   Radiology No results found.  Procedures Procedures (including critical care time)  Medications Ordered in UC Medications - No data to display  Initial Impression / Assessment and Plan / UC Course  I have reviewed the triage vital signs and the nursing notes.  Pertinent labs & imaging results that were available during my care of the patient were reviewed by me and considered in my medical decision making (see chart for details).    Point-of-care Covid test today was negative.  Patient has a pending COVID PCR at another location but has not resulted that he had performed on yesterday.  Therefore did not recollect a PCR here. Treating for acute sinus infection.  Symptoms have been present for a minimum 5 days therefore work note provided but he can return to normal activities tomorrow. Final Clinical Impressions(s) / UC Diagnoses   Final diagnoses:  Acute non-recurrent maxillary sinusitis     Discharge Instructions     Take medication with food and complete entire course of treatment.  You can also take an over-the-counter antihistamine such as Zyrtec or Claritin to help with nasal symptoms.    ED  Prescriptions    None     PDMP not reviewed  this encounter.   Bing Neighbors, FNP 07/08/20 [phone]

## 2020-07-10 LAB — NOVEL CORONAVIRUS, NAA: SARS-CoV-2, NAA: NOT DETECTED

## 2020-07-13 ENCOUNTER — Telehealth: Payer: Self-pay

## 2020-07-13 NOTE — Telephone Encounter (Signed)
Pt called and requested test results be printed for his job. Printed results and left them at the font desk. imformed patient he was neg.

## 2020-09-28 ENCOUNTER — Emergency Department (INDEPENDENT_AMBULATORY_CARE_PROVIDER_SITE_OTHER)
Admission: EM | Admit: 2020-09-28 | Discharge: 2020-09-28 | Disposition: A | Discharge status: Home / Self Care | Payer: 59 | Source: Home / Self Care

## 2020-09-28 ENCOUNTER — Other Ambulatory Visit: Payer: Self-pay

## 2020-09-28 DIAGNOSIS — R059 Cough, unspecified: Secondary | ICD-10-CM

## 2020-09-28 DIAGNOSIS — J0140 Acute pansinusitis, unspecified: Secondary | ICD-10-CM | POA: Diagnosis not present

## 2020-09-28 MED ORDER — AMOXICILLIN-POT CLAVULANATE 875-125 MG PO TABS: 1.0000 | ORAL_TABLET | Freq: Two times a day (BID) | ORAL | 0 refills | Status: AC

## 2020-09-28 NOTE — ED Provider Notes (Signed)
[name]    CSN: [id number] Arrival date & time: 09/28/20  1007      History   Chief Complaint Chief Complaint  Patient presents with  . Cough    HPI LEORY ALLINSON is a 30 y.o. male.   Reports cough, nasal congestion, mild sore throat, sinus pain and headache for the last 5 days.  Reports that he has purulent nasal drainage, reports that he is coughing up green and brown sputum.  Has history of autoimmune hepatitis, is currently taking Imuran and prednisone daily.  Has not attempted treatment for this at home.  Denies sick contacts.  Has positive history of COVID.  Has not had COVID vaccines.  Has not had flu vaccine.  Denies abdominal pain, nausea, vomiting, diarrhea, rash, fever, other symptoms.  ROS per HPI  The history is provided by the patient.  Cough   Past Medical History:  Diagnosis Date  . Autoimmune hepatitis treated with steroids (HCC)   . Cholangitis, sclerosing   . 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 Class 2 obesity due to excess calories without serious comorbidity with body mass index (BMI) of 35.0 to 35.9 in adult 01/22/2018  . History of systemic steroid therapy 07/10/2014  . Autoimmune hepatitis (HCC) 12/10/2010    Past Surgical History:  Procedure Laterality Date  . LIVER BIOPSY     x 2       Home Medications    Prior to Admission medications   Medication Sig Start Date End Date Taking? Authorizing Provider  amoxicillin-clavulanate (AUGMENTIN) 875-125 MG tablet Take 1 tablet by mouth 2 (two) times daily for 7 days. 09/28/20 10/05/20 Yes Moshe Cipro, NP  azaTHIOprine (IMURAN) 50 MG tablet Take 50 mg by mouth daily. 3 tabs daily   Yes [provider]  predniSONE (DELTASONE) 1 MG tablet Take 4 mg by mouth daily with breakfast.   Yes [provider]    Family History Family History  Problem Relation Age of Onset  . Diabetes Father   .  Heart disease Father   . Hypertension Father   . Breast cancer Maternal Grandmother   . Breast cancer Paternal Grandmother   . Colon cancer Neg Hx   . Prostate cancer Neg Hx     Social History Social History   Tobacco Use  . Smoking status: Never Smoker  . Smokeless tobacco: Never Used  Vaping Use  . Vaping Use: Never used  Substance Use Topics  . Alcohol use: No  . Drug use: No     Allergies   Latex   Review of Systems Review of Systems  Respiratory: Positive for cough.      Physical Exam Triage Vital Signs ED Triage Vitals  Enc Vitals Group     BP 09/28/20 1021 (!) 142/92     Pulse Rate 09/28/20 1021 96     Resp 09/28/20 1021 16     Temp 09/28/20 1021 98.9 F (37.2 C)     Temp Source 09/28/20 1021 Oral     SpO2 09/28/20 1021 99 %     Weight --      Height --      Head Circumference --      Peak Flow --      Pain Score 09/28/20 1019 0     Pain Loc --      Pain Edu? --  Excl. in GC? --    No data found.  Updated Vital Signs BP (!) 142/92 (BP Location: Left Arm)   Pulse 96   Temp 98.9 F (37.2 C) (Oral)   Resp 16   SpO2 99%       Physical Exam Vitals and nursing note reviewed.  Constitutional:      General: He is not in acute distress.    Appearance: Normal appearance. He is well-developed. He is not ill-appearing.  HENT:     Head: Normocephalic and atraumatic.     Right Ear: Tympanic membrane, ear canal and external ear normal.     Left Ear: Tympanic membrane, ear canal and external ear normal.     Nose: Congestion and rhinorrhea present.     Comments: Frontal and maxillary sinuses tender    Mouth/Throat:     Mouth: Mucous membranes are moist.     Pharynx: Posterior oropharyngeal erythema (Cobblestoning present) present.  Eyes:     Extraocular Movements: Extraocular movements intact.     Conjunctiva/sclera: Conjunctivae normal.     Pupils: Pupils are equal, round, and reactive to light.  Cardiovascular:     Rate and Rhythm: Normal  rate and regular rhythm.     Heart sounds: Normal heart sounds. No murmur heard.   Pulmonary:     Effort: Pulmonary effort is normal. No respiratory distress.     Breath sounds: Normal breath sounds. No stridor. No wheezing, rhonchi or rales.  Chest:     Chest wall: No tenderness.  Abdominal:     Palpations: Abdomen is soft.     Tenderness: There is no abdominal tenderness.  Musculoskeletal:        General: Normal range of motion.     Cervical back: Normal range of motion and neck supple.  Skin:    General: Skin is warm and dry.     Capillary Refill: Capillary refill takes less than 2 seconds.  Neurological:     General: No focal deficit present.     Mental Status: He is alert and oriented to person, place, and time.  Psychiatric:        Mood and Affect: Mood normal.        Behavior: Behavior normal.        Thought Content: Thought content normal.      UC Treatments / Results  Labs (all labs ordered are listed, but only abnormal results are displayed) Labs Reviewed - No data to display  EKG   Radiology No results found.  Procedures Procedures (including critical care time)  Medications Ordered in UC Medications - No data to display  Initial Impression / Assessment and Plan / UC Course  I have reviewed the triage vital signs and the nursing notes.  Pertinent labs & imaging results that were available during my care of the patient were reviewed by me and considered in my medical decision making (see chart for details).    Acute pansinusitis Cough  We will go ahead and cover for sinusitis given purulent nasal discharge and continuous immunosuppressive therapy Prescribed Augmentin 875 twice daily x7 days May take Delsym or Robitussin for cough Follow-up with no improvement over the next 48 hours Follow-up in the ER for high fever, trouble swallowing, trouble breathing, shortness of breath, other concerning symptoms  Final Clinical Impressions(s) / UC Diagnoses    Final diagnoses:  Acute non-recurrent pansinusitis  Cough     Discharge Instructions     I have sent in Augmentin for you  to take twice a day for 7 days.  Follow up with this office or with primary care if symptoms are persisting.  Follow up in the ER for high fever, trouble swallowing, trouble breathing, other concerning symptoms.     ED Prescriptions    Medication Sig Dispense Auth. Provider   amoxicillin-clavulanate (AUGMENTIN) 875-125 MG tablet Take 1 tablet by mouth 2 (two) times daily for 7 days. 14 tablet Moshe Cipro, NP     PDMP not reviewed this encounter.   Moshe Cipro, NP 09/28/20 1034

## 2020-09-28 NOTE — Discharge Instructions (Signed)
I have sent in Augmentin for you to take twice a day for 7 days.  Follow up with this office or with primary care if symptoms are persisting.  Follow up in the ER for high fever, trouble swallowing, trouble breathing, other concerning symptoms.  

## 2020-09-28 NOTE — ED Triage Notes (Signed)
Patient presents to Urgent Care with complaints of cough since about 4 days ago. Patient reports he has had some mild dizziness, thinks it's from the sinus pressure he's also having.

## 2021-05-11 ENCOUNTER — Other Ambulatory Visit: Payer: Self-pay

## 2021-05-11 ENCOUNTER — Emergency Department (INDEPENDENT_AMBULATORY_CARE_PROVIDER_SITE_OTHER)
Admission: EM | Admit: 2021-05-11 | Discharge: 2021-05-11 | Disposition: A | Discharge status: Home / Self Care | Payer: 59 | Source: Home / Self Care | Attending: Family Medicine | Admitting: Family Medicine

## 2021-05-11 ENCOUNTER — Encounter: Payer: Self-pay | Admitting: Emergency Medicine

## 2021-05-11 DIAGNOSIS — J069 Acute upper respiratory infection, unspecified: Secondary | ICD-10-CM

## 2021-05-11 NOTE — ED Triage Notes (Signed)
Cough, sore throat, headache, congestion x 4 days Unvaccinated

## 2021-05-11 NOTE — ED Provider Notes (Signed)
[facility]    CSN: [id number] Arrival date & time: 05/11/21  2376      History   Chief Complaint Chief Complaint  Patient presents with   Cough    HPI HERSEL MCMEEN is a 30 y.o. male.   HPI  Patient is here for evaluation of cough.  He states he thinks he has a "bad cold".  He states his father and little sister both have similar symptoms.  He lives at home with them.  He helps care for his father who is disabled.  He states he has a runny stuffy nose, some sinus congestion, some sore throat.  Intermittent coughing.  Feels tired.  Unknown exposure to flu.  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 Authorizing Provider  azaTHIOprine (IMURAN) 50 MG tablet Take 50 mg by mouth daily. 3 tabs daily    [provider]  predniSONE (DELTASONE) 1 MG tablet Take 4 mg by mouth daily with breakfast.    [provider]    Family History Family History  Problem Relation Age of Onset   Diabetes Father    Heart disease Father    Hypertension Father    Breast cancer Maternal Grandmother    Breast cancer Paternal Grandmother    Colon cancer Neg Hx    Prostate cancer Neg Hx     Social History Social History   Tobacco Use   Smoking status: Never   Smokeless tobacco: Never  Vaping Use   Vaping Use: Never used  Substance Use Topics    Alcohol use: No   Drug use: No     Allergies   Latex   Review of Systems Review of Systems See HPI  Physical Exam Triage Vital Signs ED Triage Vitals  Enc Vitals Group     BP 05/11/21 0919 128/86     Pulse Rate 05/11/21 0919 83     Resp 05/11/21 0919 18     Temp 05/11/21 0919 98.3 F (36.8 C)     Temp src --      SpO2 05/11/21 0919 98 %     Weight 05/11/21 0920 275 lb (124.7 kg)     Height 05/11/21 0920 6' (1.829 m)     Head Circumference --      Peak Flow --      Pain Score 05/11/21 0920 1     Pain Loc --      Pain Edu? --      Excl. in GC? --    No data found.  Updated Vital Signs BP 128/86 (BP Location: Left Arm)   Pulse 83   Temp 98.3 F (36.8 C)   Resp 18   Ht 6' (1.829 m)   Wt 124.7 kg   SpO2  98%   BMI 37.30 kg/m      Physical Exam Constitutional:      General: He is not in acute distress.    Appearance: He is well-developed.     Comments: No acute distress.  He is stocky and muscular and only mildly overweight  HENT:     Head: Normocephalic and atraumatic.     Right Ear: Tympanic membrane and ear canal normal.     Left Ear: Tympanic membrane and ear canal normal.     Nose: Congestion and rhinorrhea present.     Mouth/Throat:     Pharynx: No posterior oropharyngeal erythema.  Eyes:     Conjunctiva/sclera: Conjunctivae normal.     Pupils: Pupils are equal, round, and reactive to light.  Cardiovascular:     Rate and Rhythm: Normal rate and regular rhythm.     Heart sounds: Normal heart sounds.  Pulmonary:     Effort: Pulmonary effort is normal. No respiratory distress.     Breath sounds: Normal breath sounds.  Abdominal:     General: There is no distension.     Palpations: Abdomen is soft.  Musculoskeletal:        General: Normal range of motion.     Cervical back: Normal range of motion.  Lymphadenopathy:     Cervical: Cervical adenopathy present.  Skin:    General: Skin is warm and dry.  Neurological:     Mental Status: He is  alert.  Psychiatric:        Mood and Affect: Mood normal.        Behavior: Behavior normal.     UC Treatments / Results  Labs (all labs ordered are listed, but only abnormal results are displayed) Labs Reviewed - No data to display  EKG   Radiology No results found.  Procedures Procedures (including critical care time)  Medications Ordered in UC Medications - No data to display  Initial Impression / Assessment and Plan / UC Course  I have reviewed the triage vital signs and the nursing notes.  Pertinent labs & imaging results that were available during my care of the patient were reviewed by me and considered in my medical decision making (see chart for details).     Viral URI.  Symptomatic care appropriate. Final Clinical Impressions(s) / UC Diagnoses   Final diagnoses:  Acute upper respiratory infection     Discharge Instructions      May use over-the-counter cough and cold medicines Drink plenty of fluids Call for problems   ED Prescriptions   None    PDMP not reviewed this encounter.   Eustace Moore, MD 05/11/21 [phone]

## 2021-05-11 NOTE — Discharge Instructions (Signed)
May use over-the-counter cough and cold medicines Drink plenty of fluids Call for problems

## 2021-07-19 ENCOUNTER — Other Ambulatory Visit: Payer: Self-pay

## 2021-07-19 ENCOUNTER — Emergency Department (INDEPENDENT_AMBULATORY_CARE_PROVIDER_SITE_OTHER)
Admission: EM | Admit: 2021-07-19 | Discharge: 2021-07-19 | Disposition: A | Discharge status: Home / Self Care | Payer: 59 | Source: Home / Self Care | Attending: Family Medicine | Admitting: Family Medicine

## 2021-07-19 DIAGNOSIS — J0111 Acute recurrent frontal sinusitis: Secondary | ICD-10-CM | POA: Diagnosis not present

## 2021-07-19 MED ORDER — PREDNISONE 20 MG PO TABS: 20.0000 mg | ORAL_TABLET | Freq: Two times a day (BID) | ORAL | 0 refills | Status: DC

## 2021-07-19 MED ORDER — AMOXICILLIN-POT CLAVULANATE 875-125 MG PO TABS: 1.0000 | ORAL_TABLET | Freq: Two times a day (BID) | ORAL | 0 refills | Status: DC

## 2021-07-19 NOTE — Discharge Instructions (Signed)
Take the increased prednisone for 5 days.  40 mg a day for 5 days.  This will help with the drainage and with the feeling of asthma and wheezing Take the Augmentin antibiotic 2 times a day.  This is a strong antibiotic.  I recommend you take a probiotic while you are on Augmentin Drink lots of water See your primary care in follow-up.  You need to make a follow-up appointment for regular medical checkups

## 2021-07-19 NOTE — ED Provider Notes (Signed)
[facility]    CSN: [id number] Arrival date & time: 07/19/21  1539      History   Chief Complaint Chief Complaint  Patient presents with   Nasal Congestion    Runny nose    HPI Thomas Terrell is a 31 y.o. male.   HPI Patient has another upper respiratory infection.  He states that he is prone to these infections over the wintertime because of his impaired immune status.  He does have an autoimmune hepatitis and is on longstanding steroids and Imuran.  Currently has sinus pressure and pain, postnasal drip, sore throat, cough, shortness of breath at night.  Bad coughing spells at night that make me feel "like asthma".  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 Authorizing Provider  amoxicillin-clavulanate (AUGMENTIN) 875-125 MG tablet Take 1 tablet by mouth every 12 (twelve) hours. 07/19/21  Yes Eustace Moore, MD  predniSONE (DELTASONE) 20 MG tablet Take 1 tablet (20 mg total) by mouth 2 (two) times daily with a meal. 07/19/21  Yes Eustace Moore, MD  azaTHIOprine (IMURAN) 50 MG tablet Take 50 mg by mouth daily. 3 tabs daily    [provider]  predniSONE (DELTASONE) 1 MG tablet Take 4 mg by mouth daily with breakfast.    [provider]    Family History Family History  Problem Relation Age of Onset   Diabetes Father     Heart disease Father    Hypertension Father    Breast cancer Maternal Grandmother    Breast cancer Paternal Grandmother    Colon cancer Neg Hx    Prostate cancer Neg Hx     Social History Social History   Tobacco Use   Smoking status: Never   Smokeless tobacco: Never  Vaping Use   Vaping Use: Never used  Substance Use Topics   Alcohol use: No   Drug use: No     Allergies   Latex   Review of Systems Review of Systems See HPI  Physical Exam Triage Vital Signs ED Triage Vitals  Enc Vitals Group     BP 07/19/21 1549 (!) 153/93     Pulse Rate 07/19/21 1549 (!) 105     Resp 07/19/21 1549 18     Temp 07/19/21 1549 98 F (36.7 C)     Temp Source 07/19/21 1549 Oral     SpO2 07/19/21 1549 98 %     Weight --      Height --      Head Circumference --      Peak Flow --      Pain Score 07/19/21 1552 0     Pain Loc --      Pain Edu? --      Excl.  in GC? --    No data found.  Updated Vital Signs BP (!) 153/93 (BP Location: Left Arm)    Pulse (!) 105    Temp 98 F (36.7 C) (Oral)    Resp 18    SpO2 98%      Physical Exam Constitutional:      General: He is not in acute distress.    Appearance: He is well-developed.     Comments: Patient is overweight  HENT:     Head: Normocephalic and atraumatic.     Right Ear: Tympanic membrane and ear canal normal.     Left Ear: Tympanic membrane and ear canal normal.     Nose: Rhinorrhea present. No congestion.     Comments: Frontal and ethmoid sinuses are tender    Mouth/Throat:     Pharynx: Posterior oropharyngeal erythema present.     Comments: Posterior pharynx is injected Eyes:     Conjunctiva/sclera: Conjunctivae normal.     Pupils: Pupils are equal, round, and reactive to light.  Cardiovascular:     Rate and Rhythm: Normal rate and regular rhythm.     Heart sounds: Normal heart sounds.  Pulmonary:     Effort: Pulmonary effort is normal. No respiratory distress.     Breath sounds: Normal breath sounds.   Abdominal:     General: There is no distension.     Palpations: Abdomen is soft.  Musculoskeletal:        General: Normal range of motion.     Cervical back: Normal range of motion.  Lymphadenopathy:     Cervical: Cervical adenopathy present.  Skin:    General: Skin is warm and dry.  Neurological:     Mental Status: He is alert.     UC Treatments / Results  Labs (all labs ordered are listed, but only abnormal results are displayed) Labs Reviewed - No data to display  EKG   Radiology No results found.  Procedures Procedures (including critical care time)  Medications Ordered in UC Medications - No data to display  Initial Impression / Assessment and Plan / UC Course  I have reviewed the triage vital signs and the nursing notes.  Pertinent labs & imaging results that were available during my care of the patient were reviewed by me and considered in my medical decision making (see chart for details).     Final Clinical Impressions(s) / UC Diagnoses   Final diagnoses:  Acute recurrent frontal sinusitis     Discharge Instructions      Take the increased prednisone for 5 days.  40 mg a day for 5 days.  This will help with the drainage and with the feeling of asthma and wheezing Take the Augmentin antibiotic 2 times a day.  This is a strong antibiotic.  I recommend you take a probiotic while you are on Augmentin Drink lots of water See your primary care in follow-up.  You need to make a follow-up appointment for regular medical checkups   ED Prescriptions     Medication Sig Dispense Auth. Provider   predniSONE (DELTASONE) 20 MG tablet Take 1 tablet (20 mg total) by mouth 2 (two) times daily with a meal. 10 tablet Eustace Moore, MD   amoxicillin-clavulanate (AUGMENTIN) 875-125 MG tablet Take 1 tablet by mouth every 12 (twelve) hours. 14 tablet Eustace Moore, MD      PDMP not reviewed this encounter.   Eustace Moore, MD 07/19/21 1620

## 2021-07-19 NOTE — ED Triage Notes (Signed)
Pt c/o runny nose and cough since Thursday last week. Denies fever. Dayquil prn. Says Sat he lost his voice, but also felt like an asthma attack. No issues with asthma since childhood though.

## 2021-08-06 ENCOUNTER — Other Ambulatory Visit: Payer: Self-pay

## 2021-08-06 ENCOUNTER — Emergency Department (INDEPENDENT_AMBULATORY_CARE_PROVIDER_SITE_OTHER)
Admission: EM | Admit: 2021-08-06 | Discharge: 2021-08-06 | Disposition: A | Discharge status: Home / Self Care | Payer: 59 | Source: Home / Self Care | Attending: Family Medicine | Admitting: Family Medicine

## 2021-08-06 ENCOUNTER — Encounter: Payer: Self-pay | Admitting: Emergency Medicine

## 2021-08-06 ENCOUNTER — Emergency Department (INDEPENDENT_AMBULATORY_CARE_PROVIDER_SITE_OTHER): Payer: 59

## 2021-08-06 DIAGNOSIS — M79671 Pain in right foot: Secondary | ICD-10-CM | POA: Diagnosis not present

## 2021-08-06 DIAGNOSIS — W1781XA Fall down embankment (hill), initial encounter: Secondary | ICD-10-CM

## 2021-08-06 DIAGNOSIS — S93601A Unspecified sprain of right foot, initial encounter: Secondary | ICD-10-CM | POA: Diagnosis not present

## 2021-08-06 NOTE — ED Provider Notes (Signed)
?[facility] ? ? ? ?CSN: [id number] ?Arrival date & time: 08/06/21  1452 ? ? ?  ? ?History   ?Chief Complaint ?Chief Complaint  ?Patient presents with  ? Foot Pain  ?  right  ? ? ?HPI ?Thomas Terrell is a 31 y.o. male.  ? ?While walking his dog two days ago, the dog pushed him forward from behind causing patient's right foot/ankle to be forcibly dorsiflexed.  He has had persistent pain in the dorsal aspect of his foot. ? ?The history is provided by the patient.  ?Foot Pain ?This is a new problem. The current episode started 2 days ago. The problem occurs constantly. The problem has not changed since onset.The symptoms are aggravated by walking. The symptoms are relieved by rest. Treatments tried: Goody's powder. The treatment provided no relief.  ? ?Past Medical History:  ?Diagnosis Date  ? Autoimmune hepatitis treated with steroids (HCC)   ? Cholangitis, sclerosing   ? Cirrhosis of liver not due to alcohol (HCC)   ? ? ?Patient Active Problem List  ? Diagnosis Date Noted  ? Class 2 obesity due to excess calories without serious comorbidity with body mass index (BMI) of 35.0 to 35.9 in adult 01/22/2018  ? History of systemic steroid therapy 07/10/2014  ? Autoimmune hepatitis (HCC) 12/10/2010  ? ? ?Past Surgical History:  ?Procedure Laterality Date  ? LIVER BIOPSY    ? x 2  ? ? ? ? ? ?Home Medications   ? ?Prior to Admission medications   ?Medication Sig Start Date End Date Taking? Authorizing Provider  ?amoxicillin-clavulanate (AUGMENTIN) 875-125 MG tablet Take 1 tablet by mouth every 12 (twelve) hours. ?Patient not taking: Reported on 08/06/2021 07/19/21   Eustace Moore, MD  ?azaTHIOprine (IMURAN) 50 MG tablet Take 50 mg by mouth daily. 3 tabs daily    [provider]  ?predniSONE (DELTASONE) 1 MG tablet Take 4 mg by mouth daily with breakfast.    [provider]  ?predniSONE (DELTASONE) 20 MG tablet Take 1 tablet (20 mg total) by mouth 2 (two) times daily with a meal. ?Patient not  taking: Reported on 08/06/2021 07/19/21   Eustace Moore, MD  ?ursodiol (ACTIGALL) 300 MG capsule Take 600 mg by mouth 2 (two) times daily. 06/26/21   [provider]  ? ? ?Family History ?Family History  ?Problem Relation Age of Onset  ? Diabetes Father   ? Heart disease Father   ? Hypertension Father   ? Breast cancer Maternal Grandmother   ? Breast cancer Paternal Grandmother   ? Colon cancer Neg Hx   ? Prostate cancer Neg Hx   ? ? ?Social History ?Social History  ? ?Tobacco Use  ? Smoking status: Never  ? Smokeless tobacco: Never  ?Vaping Use  ? Vaping Use: Never used  ?Substance Use Topics  ? Alcohol use: No  ? Drug use: No  ? ? ? ?Allergies   ?Latex ? ? ?Review of Systems ?Review of Systems  ?Musculoskeletal:  Negative for joint swelling.  ?Skin:  Negative for color change and wound.  ?All other systems reviewed and are negative. ? ? ?Physical Exam ?Triage Vital Signs ?ED Triage Vitals  ?Enc Vitals Group  ?   BP 08/06/21 1521 139/87  ?   Pulse Rate 08/06/21 1521 86  ?   Resp 08/06/21 1521 17  ?   Temp 08/06/21 1521 98.2 ?F (36.8 ?C)  ?   Temp Source 08/06/21 1521 Oral  ?  SpO2 08/06/21 1521 98 %  ?   Weight 08/06/21 1524 275 lb (124.7 kg)  ?   Height 08/06/21 1524 6' (1.829 m)  ?   Head Circumference --   ?   Peak Flow --   ?   Pain Score 08/06/21 1524 4  ?   Pain Loc --   ?   Pain Edu? --   ?   Excl. in GC? --   ? ?No data found. ? ?Updated Vital Signs ?BP 139/87 (BP Location: Right Arm)   Pulse 86   Temp 98.2 ?F (36.8 ?C) (Oral)   Resp 17   Ht 6' (1.829 m)   Wt 124.7 kg   SpO2 98%   BMI 37.30 kg/m?  ? ?Visual Acuity ?Right Eye Distance:   ?Left Eye Distance:   ?Bilateral Distance:   ? ?Right Eye Near:   ?Left Eye Near:    ?Bilateral Near:    ? ?Physical Exam ?Vitals and nursing note reviewed.  ?Constitutional:   ?   General: He is not in acute distress. ?HENT:  ?   Head: Normocephalic.  ?Eyes:  ?   Pupils: Pupils are equal, round, and reactive to light.  ?Cardiovascular:  ?   Rate and  Rhythm: Normal rate.  ?Pulmonary:  ?   Effort: Pulmonary effort is normal.  ?Musculoskeletal:  ?   Right lower leg: No edema.  ?   Left lower leg: No edema.  ?   Right foot: Normal capillary refill. Tenderness and bony tenderness present. No swelling, laceration or crepitus. Normal pulse.  ?     Feet: ? ?   Comments: Right foot has tenderness to palpation dorsally over tarsals and proximal metatarsals as noted on diagram. ?   ?Skin: ?   General: Skin is warm and dry.  ?   Findings: No rash.  ?Neurological:  ?   Mental Status: He is alert.  ? ? ? ?UC Treatments / Results  ?Labs ?(all labs ordered are listed, but only abnormal results are displayed) ?Labs Reviewed - No data to display ? ?EKG ? ? ?Radiology ?DG Foot Complete Right ? ?Result Date: 08/06/2021 ?CLINICAL DATA:  Right foot pain laterally after fall today down hill, tripped, rolling injury EXAM: RIGHT FOOT COMPLETE - 3+ VIEW COMPARISON:  None. FINDINGS: There is no evidence of fracture or dislocation. There is no evidence of arthropathy or other focal bone abnormality. Soft tissues are unremarkable. IMPRESSION: Negative. Electronically Signed   By: Amie Portland M.D.   On: 08/06/2021 15:41   ? ?Procedures ?Procedures (including critical care time) ? ?Medications Ordered in UC ?Medications - No data to display ? ?Initial Impression / Assessment and Plan / UC Course  ?I have reviewed the triage vital signs and the nursing notes. ? ?Pertinent labs & imaging results that were available during my care of the patient were reviewed by me and considered in my medical decision making (see chart for details). ? ?  ?Ace wrap applied. ?Given sprain treatment instructions with range of motion and stretching exercises. ?Followup with Dr. Rodney [facility] ([facility]) if not improving about two weeks.  ? ?Final Clinical Impressions(s) / UC Diagnoses  ? ?Final diagnoses:  ?Right foot sprain, initial encounter  ? ? ? ?Discharge Instructions   ? ?  ?Apply ice  pack for 20 to 30 minutes, 3 to 4 times daily  Continue until pain and swelling decrease.  May take Ibuprofen 200mg , 4 tabs every 8 hours with food.  Begin range of motion and stretching exercises as tolerated. ?Wear your work boots during day for maximum support. ? ? ? ?ED Prescriptions   ?None ?  ? ? ?  ?Lattie Haw, MD ?08/08/21 2004 ? ?

## 2021-08-06 NOTE — Discharge Instructions (Signed)
Apply ice pack for 20 to 30 minutes, 3 to 4 times daily  Continue until pain and swelling decrease.  May take Ibuprofen 200mg , 4 tabs every 8 hours with food.  Begin range of motion and stretching exercises as tolerated. ?Wear your work boots during day for maximum support. ?

## 2021-08-06 NOTE — ED Triage Notes (Signed)
Rolled right foot back up onto leg when his dog pushed him from behind  ?Pain to the outside of right foot  ?Ice & elevation  ?OTC  Goody's powder at 1230 ?

## 2021-08-19 ENCOUNTER — Emergency Department (INDEPENDENT_AMBULATORY_CARE_PROVIDER_SITE_OTHER)
Admission: EM | Admit: 2021-08-19 | Discharge: 2021-08-19 | Disposition: A | Discharge status: Home / Self Care | Payer: 59 | Source: Home / Self Care

## 2021-08-19 ENCOUNTER — Other Ambulatory Visit: Payer: Self-pay

## 2021-08-19 ENCOUNTER — Encounter: Payer: Self-pay | Admitting: Emergency Medicine

## 2021-08-19 DIAGNOSIS — S93601D Unspecified sprain of right foot, subsequent encounter: Secondary | ICD-10-CM | POA: Diagnosis not present

## 2021-08-19 NOTE — Discharge Instructions (Signed)
Advised patient he is cleared to return to work at full duty today per his wishes. ?

## 2021-08-19 NOTE — ED Triage Notes (Signed)
Need medical clearance to come off light duty at work. Sprained RT foot. ?Denies pain,  ?

## 2021-08-19 NOTE — ED Provider Notes (Signed)
?[facility] ? ? ? ?CSN: [id number] ?Arrival date & time: 08/19/21  1132 ? ? ?  ? ?History   ?Chief Complaint ?Chief Complaint  ?Patient presents with  ? Medical Clearance  ? ? ?HPI ?Thomas Terrell is a 31 y.o. male.  ? ?HPI 31 year old male presents with request for medical clearance to return to work from previously sprained right foot.  Reports needs medical clearance to come off light duty at work.  Patient was evaluated here on 08/06/2021 for right foot sprain, initial encounter.  Patient reports has been on light duty at work for the past 13 days and would like to return to full duty immediately.  Reports right foot sprain has fully resolved and feels good. ? ?Past Medical History:  ?Diagnosis Date  ? Autoimmune hepatitis treated with steroids (HCC)   ? Cholangitis, sclerosing   ? Cirrhosis of liver not due to alcohol (HCC)   ? ? ?Patient Active Problem List  ? Diagnosis Date Noted  ? Class 2 obesity due to excess calories without serious comorbidity with body mass index (BMI) of 35.0 to 35.9 in adult 01/22/2018  ? History of systemic steroid therapy 07/10/2014  ? Autoimmune hepatitis (HCC) 12/10/2010  ? ? ?Past Surgical History:  ?Procedure Laterality Date  ? LIVER BIOPSY    ? x 2  ? ? ? ? ? ?Home Medications   ? ?Prior to Admission medications   ?Medication Sig Start Date End Date Taking? Authorizing Provider  ?amoxicillin-clavulanate (AUGMENTIN) 875-125 MG tablet Take 1 tablet by mouth every 12 (twelve) hours. ?Patient not taking: Reported on 08/06/2021 07/19/21   Eustace Moore, MD  ?azaTHIOprine (IMURAN) 50 MG tablet Take 50 mg by mouth daily. 3 tabs daily    [provider]  ?predniSONE (DELTASONE) 1 MG tablet Take 4 mg by mouth daily with breakfast.    [provider]  ?ursodiol (ACTIGALL) 300 MG capsule Take 600 mg by mouth 2 (two) times daily. 06/26/21   [provider]  ? ? ?Family History ?Family History  ?Problem Relation Age of Onset  ? Diabetes Father   ?  Heart disease Father   ? Hypertension Father   ? Breast cancer Maternal Grandmother   ? Breast cancer Paternal Grandmother   ? Colon cancer Neg Hx   ? Prostate cancer Neg Hx   ? ? ?Social History ?Social History  ? ?Tobacco Use  ? Smoking status: Never  ? Smokeless tobacco: Never  ?Vaping Use  ? Vaping Use: Never used  ?Substance Use Topics  ? Alcohol use: No  ? Drug use: No  ? ? ? ?Allergies   ?Latex ? ? ?Review of Systems ?Review of Systems  ?Musculoskeletal:   ?     Right foot sprain from 08/06/2021 has resolved and request medical clearance to return to work and come off light duty.  ?All other systems reviewed and are negative. ? ? ?Physical Exam ?Triage Vital Signs ?ED Triage Vitals [08/19/21 1240]  ?Enc Vitals Group  ?   BP 127/83  ?   Pulse Rate 66  ?   Resp 18  ?   Temp 98 ?F (36.7 ?C)  ?   Temp Source Oral  ?   SpO2 99 %  ?   Weight 265 lb (120.2 kg)  ?   Height 6' (1.829 m)  ?   Head Circumference   ?   Peak Flow   ?   Pain Score 0  ?  Pain Loc   ?   Pain Edu?   ?   Excl. in GC?   ? ?No data found. ? ?Updated Vital Signs ?BP 127/83 (BP Location: Left Arm)   Pulse 66   Temp 98 ?F (36.7 ?C) (Oral)   Resp 18   Ht 6' (1.829 m)   Wt 265 lb (120.2 kg)   SpO2 99%   BMI 35.94 kg/m?  ? ? ?Physical Exam ?Vitals and nursing note reviewed.  ?Constitutional:   ?   General: He is not in acute distress. ?   Appearance: Normal appearance. He is obese. He is not ill-appearing.  ?HENT:  ?   Head: Normocephalic and atraumatic.  ?   Mouth/Throat:  ?   Mouth: Mucous membranes are moist.  ?   Pharynx: Oropharynx is clear.  ?Eyes:  ?   Extraocular Movements: Extraocular movements intact.  ?   Conjunctiva/sclera: Conjunctivae normal.  ?   Pupils: Pupils are equal, round, and reactive to light.  ?Cardiovascular:  ?   Rate and Rhythm: Normal rate and regular rhythm.  ?   Pulses: Normal pulses.  ?   Heart sounds: Normal heart sounds.  ?Pulmonary:  ?   Effort: Pulmonary effort is normal.  ?   Breath sounds: Normal breath  sounds. No wheezing, rhonchi or rales.  ?Musculoskeletal:     ?   General: Normal range of motion.  ?   Cervical back: Normal range of motion and neck supple.  ?Skin: ?   General: Skin is warm and dry.  ?Neurological:  ?   General: No focal deficit present.  ?   Mental Status: He is alert and oriented to person, place, and time.  ? ? ? ?UC Treatments / Results  ?Labs ?(all labs ordered are listed, but only abnormal results are displayed) ?Labs Reviewed - No data to display ? ?EKG ? ? ?Radiology ?No results found. ? ?Procedures ?Procedures (including critical care time) ? ?Medications Ordered in UC ?Medications - No data to display ? ?Initial Impression / Assessment and Plan / UC Course  ?I have reviewed the triage vital signs and the nursing notes. ? ?Pertinent labs & imaging results that were available during my care of the patient were reviewed by me and considered in my medical decision making (see chart for details). ? ?  ? ?MDM: 1.  Right foot sprain, subsequent encounter-patient was on light duty for right foot sprain for the past 13 days or since 08/06/2021.  Patient request to return to work full duty without restrictions immediately.  Work note for medical clearance provided per patient request.  Patient discharged, hemodynamically stable. ?Final Clinical Impressions(s) / UC Diagnoses  ? ?Final diagnoses:  ?Right foot sprain, subsequent encounter  ? ? ? ?Discharge Instructions   ? ?  ?Advised patient he is cleared to return to work at full duty today per his wishes. ? ? ? ? ?ED Prescriptions   ?None ?  ? ?PDMP not reviewed this encounter. ?  ?Trevor Iha, FNP ?08/19/21 1406 ? ?

## 2021-11-08 ENCOUNTER — Emergency Department (INDEPENDENT_AMBULATORY_CARE_PROVIDER_SITE_OTHER)
Admission: EM | Admit: 2021-11-08 | Discharge: 2021-11-08 | Disposition: A | Discharge status: Home / Self Care | Payer: 59 | Source: Home / Self Care

## 2021-11-08 ENCOUNTER — Encounter: Payer: Self-pay | Admitting: Emergency Medicine

## 2021-11-08 DIAGNOSIS — H00014 Hordeolum externum left upper eyelid: Secondary | ICD-10-CM | POA: Diagnosis not present

## 2021-11-08 DIAGNOSIS — H01014 Ulcerative blepharitis left upper eyelid: Secondary | ICD-10-CM | POA: Diagnosis not present

## 2021-11-08 MED ORDER — POLYMYXIN B-TRIMETHOPRIM 10000-0.1 UNIT/ML-% OP SOLN: 1.0000 [drp] | Freq: Two times a day (BID) | OPHTHALMIC | 0 refills | Status: AC

## 2021-11-08 MED ORDER — SULFAMETHOXAZOLE-TRIMETHOPRIM 800-160 MG PO TABS: 1.0000 | ORAL_TABLET | Freq: Two times a day (BID) | ORAL | 0 refills | Status: DC

## 2021-11-08 NOTE — ED Provider Notes (Signed)
Thomas Terrell    CSN: [id number] Arrival date & time: 11/08/21  0830      History   Chief Complaint Chief Complaint  Patient presents with   Eye Problem    Left eye    HPI Thomas Terrell is a 31 y.o. male.   HPI Patient presents with swelling, pain, and irritation involving the left eye lid. Swelling developed 2 days ago. He wears contact lenses but denies leaving contact lens in overnight. Unknown if bitten by an insect. He has had drainage from left eye mostly clear. Endorses itching and irritation involving the left inner eye. Unable to perform eye chart exam as eye lid swelling is obstructing ability to see completely out of left eye.  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 Authorizing Provider  sulfamethoxazole-trimethoprim (BACTRIM DS) 800-160 MG tablet Take 1 tablet by mouth 2 (two) times daily. 11/08/21  Yes Bing Neighbors, FNP  trimethoprim-polymyxin b (POLYTRIM) ophthalmic solution Place 1 drop into the left eye 2 (two) times daily for 5 days. 11/08/21 11/13/21 Yes Bing Neighbors, FNP  amoxicillin-clavulanate (AUGMENTIN) 875-125 MG tablet Take 1 tablet by mouth every 12 (twelve) hours. Patient not taking: Reported on 08/06/2021 07/19/21   Eustace Moore, MD  azaTHIOprine (IMURAN) 50 MG tablet Take 50 mg by mouth daily. 3 tabs daily    [provider]  predniSONE  (DELTASONE) 1 MG tablet Take 4 mg by mouth daily with breakfast.    [provider]  ursodiol (ACTIGALL) 300 MG capsule Take 600 mg by mouth 2 (two) times daily. 06/26/21   [provider]    Family History Family History  Problem Relation Age of Onset   Diabetes Father    Heart disease Father    Hypertension Father    Breast cancer Maternal Grandmother    Breast cancer Paternal Grandmother    Colon cancer Neg Hx    Prostate cancer Neg Hx     Social History Social History   Tobacco Use   Smoking status: Never   Smokeless tobacco: Never  Vaping Use   Vaping Use: Never used  Substance Use Topics   Alcohol use: No   Drug use: No     Allergies   Latex   Review of Systems Review of Systems Pertinent negatives listed in HPI  Physical Exam Triage Vital Signs ED Triage Vitals  Enc Vitals Group     BP 11/08/21 0920 138/86     Pulse Rate 11/08/21 0920 72     Resp 11/08/21 0920 15     Temp 11/08/21 0920 98.7 F (37.1 C)     Temp Source 11/08/21 0920 Oral     SpO2 11/08/21 0920 98 %     Weight 11/08/21 0922 265 lb (120.2 kg)  Height 11/08/21 0922 6' (1.829 m)     Head Circumference --      Peak Flow --      Pain Score 11/08/21 0922 2     Pain Loc --      Pain Edu? --      Excl. in GC? --    No data found.  Updated Vital Signs BP 138/86 (BP Location: Left Arm)   Pulse 72   Temp 98.7 F (37.1 C) (Oral)   Resp 15   Ht 6' (1.829 m)   Wt 265 lb (120.2 kg)   SpO2 98%   BMI 35.94 kg/m   Visual Acuity Right Eye Distance:   Left Eye Distance:   Bilateral Distance:    Right Eye Near:   Left Eye Near:    Bilateral Near:     Physical Exam Constitutional:      Appearance: Normal appearance.  HENT:     Head: Normocephalic and atraumatic.  Eyes:     General:        Left eye: Discharge and hordeolum present.    Extraocular Movements:     Left eye: Normal extraocular motion.     Conjunctiva/sclera:     Left eye: Left conjunctiva is  injected.     Comments: Upper left eye lid swelling with erythema present   Cardiovascular:     Rate and Rhythm: Normal rate and regular rhythm.  Pulmonary:     Effort: Pulmonary effort is normal.     Breath sounds: Normal breath sounds.  Neurological:     Mental Status: He is alert.     GCS: GCS eye subscore is 4. GCS verbal subscore is 5. GCS motor subscore is 6.     UC Treatments / Results  Labs (all labs ordered are listed, but only abnormal results are displayed) Labs Reviewed - No data to display  EKG   Radiology No results found.  Procedures Procedures (including critical care time)  Medications Ordered in UC Medications - No data to display  Initial Impression / Assessment and Plan / UC Course  I have reviewed the triage vital signs and the nursing notes.  Pertinent labs & imaging results that were available during my care of the patient were reviewed by me and considered in my medical decision making (see chart for details).    Ulcerative blepharitis with Hordeolum Continue warm compresses. Complete treatment per discharge medication orders. Follow-up if symptoms worsen or do not completely resolve with treatment.  Final Clinical Impressions(s) / UC Diagnoses   Final diagnoses:  Ulcerative blepharitis of left upper eyelid  Hordeolum externum of left upper eyelid     Discharge Instructions      Take antibiotic with food and complete the entire 10 day course. Polytrim instill one drop into the left eye twice daily for 5 days Continue warm compress to eye lid until swelling resolves.     ED Prescriptions     Medication Sig Dispense Auth. Provider   sulfamethoxazole-trimethoprim (BACTRIM DS) 800-160 MG tablet Take 1 tablet by mouth 2 (two) times daily. 20 tablet Bing Neighbors, FNP   trimethoprim-polymyxin b (POLYTRIM) ophthalmic solution Place 1 drop into the left eye 2 (two) times daily for 5 days. 10 mL Bing Neighbors, FNP      PDMP  not reviewed this encounter.   Bing Neighbors, FNP 11/09/21 1901

## 2021-11-08 NOTE — Discharge Instructions (Addendum)
Take antibiotic with food and complete the entire 10 day course. Polytrim instill one drop into the left eye twice daily for 5 days Continue warm compress to eye lid until swelling resolves.

## 2021-11-08 NOTE — ED Triage Notes (Signed)
Swelling noted to left eyelid  w/ redness No OTC meds for pain  Used OTC stye medicine on sat & Sunday - no relief  Started on Saturday  Cool compresses No known  injury or insect bite

## 2021-11-17 ENCOUNTER — Emergency Department (INDEPENDENT_AMBULATORY_CARE_PROVIDER_SITE_OTHER)
Admission: EM | Admit: 2021-11-17 | Discharge: 2021-11-17 | Disposition: A | Discharge status: Home / Self Care | Payer: 59 | Source: Home / Self Care

## 2021-11-17 ENCOUNTER — Encounter: Payer: Self-pay | Admitting: Emergency Medicine

## 2021-11-17 DIAGNOSIS — H00014 Hordeolum externum left upper eyelid: Secondary | ICD-10-CM | POA: Diagnosis not present

## 2021-11-17 NOTE — ED Provider Notes (Signed)
[name]    CSN: [id number] Arrival date & time: 11/17/21  0801      History   Chief Complaint Chief Complaint  Patient presents with   Facial Swelling    HPI Thomas Terrell is a 30 y.o. male.   HPI  Patient is here for follow-up from his visit on 11/08/2021 for hordeolum left eye.  He states that he has been taking the Septra DS 2 times a day.  He is using the Polytrim eyedrops 2 times a day.  He has been using warm compresses.  He states that last night the bump in the center of his eyelid drained yellow and clear fluid.  He states that it is painful.  He states the lid is still swollen and he cannot wear contact lenses.  He is cautioned not to even try to wear contact lenses until this has been cleared.  He tried to see his eye doctor.  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 Authorizing Provider  trimethoprim-polymyxin b (POLYTRIM) ophthalmic solution Place 1 drop into the left eye 2 (two) times daily.   Yes [provider]  azaTHIOprine (IMURAN) 50 MG tablet Take 50 mg by mouth daily. 3 tabs daily    [provider]  predniSONE (DELTASONE) 1 MG tablet Take 4 mg by mouth daily with breakfast.    [provider]  sulfamethoxazole-trimethoprim (BACTRIM DS) 800-160 MG tablet Take 1 tablet by mouth 2 (two) times daily.  11/08/21   Bing Neighbors, FNP  ursodiol (ACTIGALL) 300 MG capsule Take 600 mg by mouth 2 (two) times daily. 06/26/21   [provider]    Family History Family History  Problem Relation Age of Onset   Diabetes Father    Heart disease Father    Hypertension Father    Breast cancer Maternal Grandmother    Breast cancer Paternal Grandmother    Colon cancer Neg Hx    Prostate cancer Neg Hx     Social History Social History   Tobacco Use   Smoking status: Never   Smokeless tobacco: Never  Vaping Use   Vaping Use: Never used  Substance Use Topics   Alcohol use: No   Drug use: No     Allergies   Latex   Review of Systems Review of Systems See HPI  Physical Exam Triage Vital Signs ED Triage Vitals  Enc Vitals Group     BP 11/17/21 0818 120/74     Pulse Rate 11/17/21 0818 74     Resp 11/17/21 0818 15     Temp 11/17/21 0818 98.8 F (37.1 C)     Temp Source 11/17/21 0818 Oral     SpO2 11/17/21 0818 98 %     Weight 11/17/21 0821  265 lb (120.2 kg)     Height 11/17/21 0821 6' (1.829 m)     Head Circumference --      Peak Flow --      Pain Score 11/17/21 0820 2     Pain Loc --      Pain Edu? --      Excl. in GC? --    No data found.  Updated Vital Signs BP 120/74 (BP Location: Right Arm)   Pulse 74   Temp 98.8 F (37.1 C) (Oral)   Resp 15   Ht 6' (1.829 m)   Wt 120.2 kg   SpO2 98%   BMI 35.94 kg/m       Physical Exam Constitutional:      General: He is not in acute distress.    Appearance: Normal appearance. He is well-developed.  HENT:     Head: Normocephalic and atraumatic.  Eyes:     General: Vision grossly intact. Gaze aligned appropriately.        Right eye: No discharge.        Left eye: Hordeolum present.No discharge.     Conjunctiva/sclera: Conjunctivae normal.     Pupils: Pupils are equal, round, and reactive to light.      Comments: Left upper lid is swollen.  Violaceous.  There is a 1 cm nodule centrally that has an eschar.   Very tender.  No drainage  Cardiovascular:     Rate and Rhythm: Normal rate.  Pulmonary:     Effort: Pulmonary effort is normal. No respiratory distress.  Abdominal:     General: There is no distension.     Palpations: Abdomen is soft.  Musculoskeletal:        General: Normal range of motion.     Cervical back: Normal range of motion.  Skin:    General: Skin is warm and dry.  Neurological:     Mental Status: He is alert.      UC Treatments / Results  Labs (all labs ordered are listed, but only abnormal results are displayed) Labs Reviewed - No data to display  EKG   Radiology No results found.  Procedures Procedures (including critical care time)  Medications Ordered in UC Medications - No data to display  Initial Impression / Assessment and Plan / UC Course  I have reviewed the triage vital signs and the nursing notes.  Pertinent labs & imaging results that were available during my care of the patient were reviewed by me and considered in my medical decision making (see chart for details).     I called [facility] in [place].  They are on-call for unassigned, and ophthalmology.  An appointment was made for this morning. Final Clinical Impressions(s) / UC Diagnoses   Final diagnoses:  Hordeolum externum of left upper eyelid     Discharge Instructions      Continue the antibiotics and the eye drops 2 x a day Warm compresses 2-3 x a day See eye doctor in follow up:  go there now [facility] [address]   ED Prescriptions   None    PDMP not reviewed this encounter.   Eustace Moore, MD 11/17/21 [phone]

## 2021-11-17 NOTE — ED Triage Notes (Signed)
Return visit from 11/08/21 Taking antibiotics Swelling to left eyelid was getting better Wiped his eyelid yesterday & the eyelid started draining yellow & clear First time it drained was yesterday Scab formed last night

## 2021-11-17 NOTE — Discharge Instructions (Signed)
Continue the antibiotics and the eye drops 2 x a day Warm compresses 2-3 x a day See eye doctor in follow up:  go there now [facility] [address]

## 2022-05-26 ENCOUNTER — Ambulatory Visit
Admission: EM | Admit: 2022-05-26 | Discharge: 2022-05-26 | Disposition: A | Discharge status: Home / Self Care | Payer: 59 | Attending: Family Medicine | Admitting: Family Medicine

## 2022-05-26 DIAGNOSIS — R053 Chronic cough: Secondary | ICD-10-CM

## 2022-05-26 MED ORDER — PREDNISONE 20 MG PO TABS: ORAL_TABLET | ORAL | 0 refills | Status: DC

## 2022-05-26 NOTE — Discharge Instructions (Signed)
May take Delsym Cough Suppressant ("12 Hour Cough Relief") at bedtime for nighttime cough.   If symptoms become significantly worse during the night or over the weekend, proceed to the local emergency room.

## 2022-05-26 NOTE — ED Triage Notes (Signed)
Pt presents with c/o continued cough that began thanksgiving.

## 2022-05-26 NOTE — ED Provider Notes (Signed)
[name]    CSN: [id number] Arrival date & time: 05/26/22  2263      History   Chief Complaint Chief Complaint  Patient presents with   Cough    HPI Thomas Terrell is a 31 y.o. male.   Patient reports that he developed cold-like symptoms one month ago with typical congestion, scratchy throat, sinus congestion, and cough.  All symptoms resolved except for a persistent cough, and he feels well otherwise.  He denies fever, pleuritic pain, and shortness of breath.  He sometimes coughs until he gags, and recently he has noticed occasional wheezing. He had active asthma as a child, and family history is positive for asthma in his father and sister.  The history is provided by the patient.    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 Authorizing Provider  predniSONE (DELTASONE) 20 MG tablet Take one tab by mouth twice daily for 4 days, then one daily for 3 days. Take with food. 05/26/22  Yes Lattie Haw, MD  azaTHIOprine (IMURAN) 50 MG tablet Take 50 mg by mouth daily. 3 tabs daily    [provider]  sulfamethoxazole-trimethoprim (BACTRIM DS) 800-160 MG tablet Take 1 tablet by mouth 2 (two) times daily. 11/08/21   Bing Neighbors, FNP  trimethoprim-polymyxin b (POLYTRIM) ophthalmic solution Place 1 drop into the left eye 2 (two) times daily. Patient not taking: Reported on  05/26/2022    [provider]  ursodiol (ACTIGALL) 300 MG capsule Take 600 mg by mouth 2 (two) times daily. 06/26/21   [provider]    Family History Family History  Problem Relation Age of Onset   Diabetes Father    Heart disease Father    Hypertension Father    Breast cancer Maternal Grandmother    Breast cancer Paternal Grandmother    Colon cancer Neg Hx    Prostate cancer Neg Hx     Social History Social History   Tobacco Use   Smoking status: Never   Smokeless tobacco: Never  Vaping Use   Vaping Use: Never used  Substance Use Topics   Alcohol use: No   Drug use: No     Allergies   Latex   Review of Systems Review of Systems No sore throat + cough No pleuritic pain + wheezing No nasal congestion No post-nasal drainage No sinus pain/pressure No itchy/red eyes No earache No hemoptysis No SOB No fever/chills No nausea No vomiting No abdominal pain No diarrhea No urinary symptoms No skin rash No fatigue No myalgias No headache   Physical Exam Triage Vital Signs ED Triage Vitals [05/26/22 1120]  Enc Vitals Group     BP 131/82     Pulse Rate 60     Resp 14     Temp 98.2 F (36.8  C)     Temp Source Oral     SpO2 99 %     Weight      Height      Head Circumference      Peak Flow      Pain Score 0     Pain Loc      Pain Edu?      Excl. in GC?    No data found.  Updated Vital Signs BP 131/82 (BP Location: Right Arm)   Pulse 60   Temp 98.2 F (36.8 C) (Oral)   Resp 14   SpO2 99%   Visual Acuity Right Eye Distance:   Left Eye Distance:   Bilateral Distance:    Right Eye Near:   Left Eye Near:    Bilateral Near:     Physical Exam Nursing notes and Vital Signs reviewed. Appearance:  Patient appears stated age, and in no acute distress Eyes:  Pupils are equal, round, and reactive to light and accomodation.  Extraocular movement is intact.  Conjunctivae are not inflamed  Ears:  Canals normal.  Tympanic  membranes normal.  Nose:  Normal turbinates.  No sinus tenderness.   Pharynx:  Normal Neck:  Supple. No adenopathy.  Lungs:  Clear to auscultation.  Breath sounds are equal.  Moving air well. Heart:  Regular rate and rhythm without murmurs, rubs, or gallops.  Abdomen:  Nontender without masses or hepatosplenomegaly.  Bowel sounds are present.  No CVA or flank tenderness.  Extremities:  No edema.  Skin:  No rash present.   UC Treatments / Results  Labs (all labs ordered are listed, but only abnormal results are displayed) Labs Reviewed - No data to display  EKG   Radiology No results found.  Procedures Procedures (including critical care time)  Medications Ordered in UC Medications - No data to display  Initial Impression / Assessment and Plan / UC Course  I have reviewed the triage vital signs and the nursing notes.  Pertinent labs & imaging results that were available during my care of the patient were reviewed by me and considered in my medical decision making (see chart for details).    There is no evidence of bacterial infection today.  Suspect post-infectious cough. Begin prednisone burst/taper. Followup with Family Doctor if not improved in one week.   Final Clinical Impressions(s) / UC Diagnoses   Final diagnoses:  Persistent cough for 3 weeks or longer     Discharge Instructions      May take Delsym Cough Suppressant ("12 Hour Cough Relief") at bedtime for nighttime cough.   If symptoms become significantly worse during the night or over the weekend, proceed to the local emergency room.     ED Prescriptions     Medication Sig Dispense Auth. Provider   predniSONE (DELTASONE) 20 MG tablet Take one tab by mouth twice daily for 4 days, then one daily for 3 days. Take with food. 11 tablet Lattie Haw, MD         Lattie Haw, MD 05/28/22 [phone]

## 2023-01-05 ENCOUNTER — Ambulatory Visit
Admission: EM | Admit: 2023-01-05 | Discharge: 2023-01-05 | Disposition: A | Discharge status: Home / Self Care | Payer: 59 | Attending: Family Medicine | Admitting: Family Medicine

## 2023-01-05 ENCOUNTER — Other Ambulatory Visit: Payer: Self-pay

## 2023-01-05 DIAGNOSIS — J309 Allergic rhinitis, unspecified: Secondary | ICD-10-CM | POA: Diagnosis not present

## 2023-01-05 DIAGNOSIS — J0100 Acute maxillary sinusitis, unspecified: Secondary | ICD-10-CM | POA: Diagnosis not present

## 2023-01-05 DIAGNOSIS — R059 Cough, unspecified: Secondary | ICD-10-CM | POA: Diagnosis not present

## 2023-01-05 DIAGNOSIS — J3489 Other specified disorders of nose and nasal sinuses: Secondary | ICD-10-CM

## 2023-01-05 LAB — POC SARS CORONAVIRUS 2 AG -  ED: SARS Coronavirus 2 Ag: NEGATIVE

## 2023-01-05 MED ORDER — FEXOFENADINE HCL 180 MG PO TABS: 180.0000 mg | ORAL_TABLET | Freq: Every day | ORAL | 0 refills | Status: DC

## 2023-01-05 MED ORDER — AMOXICILLIN-POT CLAVULANATE 875-125 MG PO TABS: 1.0000 | ORAL_TABLET | Freq: Two times a day (BID) | ORAL | 0 refills | Status: AC

## 2023-01-05 MED ORDER — PREDNISONE 10 MG (21) PO TBPK: ORAL_TABLET | Freq: Every day | ORAL | 0 refills | Status: DC

## 2023-01-05 MED ORDER — BENZONATATE 200 MG PO CAPS: 200.0000 mg | ORAL_CAPSULE | Freq: Three times a day (TID) | ORAL | 0 refills | Status: AC | PRN

## 2023-01-05 MED ORDER — PROMETHAZINE-DM 6.25-15 MG/5ML PO SYRP: 5.0000 mL | ORAL_SOLUTION | Freq: Two times a day (BID) | ORAL | 0 refills | Status: DC | PRN

## 2023-01-05 NOTE — ED Triage Notes (Signed)
C/o cough, congestion since Monday night

## 2023-01-05 NOTE — ED Provider Notes (Signed)
[facility]    CSN: [id number] Arrival date & time: 01/05/23  1302      History   Chief Complaint No chief complaint on file.   HPI Thomas Terrell is a 32 y.o. male.   HPI 32 year old male presents with cough and congestion for 4 days.  PMH significant for obesity, autoimmune hepatitis, and history of systemic steroid therapy.  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 Authorizing Provider  amoxicillin-clavulanate (AUGMENTIN) 875-125 MG tablet Take 1 tablet by mouth 2 (two) times daily for 10 days. 01/05/23 01/15/23 Yes Trevor Iha, FNP  benzonatate (TESSALON) 200 MG capsule Take 1 capsule (200 mg total) by mouth 3 (three) times daily as needed for up to 7 days. 01/05/23 01/12/23 Yes Trevor Iha, FNP  fexofenadine [facility] ALLERGY) 180 MG tablet Take 1 tablet (180 mg total) by mouth daily for 15 days. 01/05/23 01/20/23 Yes Trevor Iha, FNP  predniSONE (STERAPRED UNI-PAK 21 TAB) 10 MG (21) TBPK tablet Take by mouth daily. Take 6 tabs by mouth daily  for 2 days, then 5 tabs for 2 days, then 4 tabs for 2 days, then 3 tabs for 2 days, 2 tabs for 2 days, then 1 tab by mouth daily for 2 days 01/05/23  Yes Trevor Iha, FNP  promethazine-dextromethorphan (PROMETHAZINE-DM) 6.25-15 MG/5ML syrup Take 5 mLs by mouth 2 (two) times daily as needed for cough. 01/05/23  Yes Trevor Iha, FNP  azaTHIOprine  (IMURAN) 50 MG tablet Take 50 mg by mouth daily. 3 tabs daily    [provider]  ursodiol (ACTIGALL) 300 MG capsule Take 600 mg by mouth 2 (two) times daily. 06/26/21   [provider]    Family History Family History  Problem Relation Age of Onset   Diabetes Father    Heart disease Father    Hypertension Father    Breast cancer Maternal Grandmother    Breast cancer Paternal Grandmother    Colon cancer Neg Hx    Prostate cancer Neg Hx     Social History Social History   Tobacco Use   Smoking status: Never   Smokeless tobacco: Never  Vaping Use   Vaping status: Never Used  Substance Use Topics   Alcohol use: No   Drug use: No     Allergies   Latex   Review of Systems Review of Systems  HENT:  Positive for congestion, postnasal drip, sinus pressure, sinus pain and sore throat.   Respiratory:  Positive for cough.   All other systems reviewed and are negative.    Physical Exam Triage Vital Signs ED Triage Vitals [01/05/23 1443]  Encounter Vitals Group     BP (!) 143/83     Systolic BP Percentile      Diastolic BP Percentile      Pulse Rate  82     Resp 16     Temp 97.7 F (36.5 C)     Temp Source Oral     SpO2 98 %     Weight      Height      Head Circumference      Peak Flow      Pain Score 0     Pain Loc      Pain Education      Exclude from Growth Chart    No data found.  Updated Vital Signs BP (!) 143/83 (BP Location: Left Arm)   Pulse 82   Temp 97.7 F (36.5 C) (Oral)   Resp 16   SpO2 98%    Physical Exam Vitals and nursing note reviewed.  Constitutional:      Appearance: Normal appearance. He is obese. He is ill-appearing.  HENT:     Head: Normocephalic and atraumatic.     Right Ear: External ear normal.     Left Ear: External ear normal.     Ears:     Comments: TM's are clear, retracted with good light reflex and mobility; moderate eustachian tube dysfunction noted bilaterally    Nose:     Right Sinus: Maxillary  sinus tenderness and frontal sinus tenderness present.     Left Sinus: Maxillary sinus tenderness and frontal sinus tenderness present.     Comments: Turbinates are erythematous/edematous    Mouth/Throat:     Mouth: Mucous membranes are moist.     Pharynx: Oropharynx is clear. Uvula midline. Postnasal drip present.     Comments: Significant amount of clear drainage of posterior oropharynx noted Eyes:     Extraocular Movements: Extraocular movements intact.     Conjunctiva/sclera: Conjunctivae normal.     Pupils: Pupils are equal, round, and reactive to light.  Cardiovascular:     Rate and Rhythm: Normal rate and regular rhythm.     Pulses: Normal pulses.     Heart sounds: Normal heart sounds.  Pulmonary:     Effort: Pulmonary effort is normal.     Breath sounds: Normal breath sounds. No wheezing, rhonchi or rales.     Comments: Infrequent nonproductive cough noted on exam Musculoskeletal:        General: Normal range of motion.     Cervical back: Normal range of motion and neck supple.  Skin:    General: Skin is warm and dry.  Neurological:     General: No focal deficit present.     Mental Status: He is alert and oriented to person, place, and time. Mental status is at baseline.  Psychiatric:        Mood and Affect: Mood normal.        Behavior: Behavior normal.        Thought Content: Thought content normal.      UC Treatments / Results  Labs (all labs ordered are listed, but only abnormal results are displayed) Labs Reviewed  POC SARS CORONAVIRUS 2 AG -  ED    EKG   Radiology No results found.  Procedures Procedures (including critical care time)  Medications Ordered in UC Medications - No data to display  Initial Impression / Assessment and Plan / UC Course  I have reviewed the triage vital signs and the nursing notes.  Pertinent labs & imaging results that were available during my care of the patient were reviewed by me and considered in my medical  decision making (see chart for details).  MDM: 1.  Acute maxillary sinusitis, recurrence not specified-Rx'd Augmentin 875/125 mg tablet twice daily x 10 days; 2.  Sinus pressure-Rx Sterapred Unipak (tapering from 60 mg to 10 mg over 10 days); 3.  Allergic rhinitis, unspecified seasonality, unspecified trigger-Rx'd Allegra 180 mg fexofenadine without D daily for 5 days, then as needed for concurrent postnasal drainage/drip; 4.  Cough, unspecified type-Rx'd Tessalon Perles 200 mg capsule 3 times daily, as needed, Promethazine DM 6.25-15 mg / 5 mL syrup: Take 5 mL twice daily, as needed for cough. Patient to take medications as directed with food to completion.  Advised patient to take prednisone and Allegra with first dose of Augmentin for the next 10 days.  Advised may discontinue Allegra after 5 days and use as needed for concurrent postnasal drainage/drip.  Advised may use Tessalon Perles daily or as needed for cough.  Advised may use Promethazine DM at night for cough due to sedate of effects.  Advised patient to increase daily water intake to 64 ounces per day while taking these medications.  Advised if symptoms worsen and/or unresolved please follow-up with PCP or here for further evaluation.  Patient discharged home, hemodynamically stable.  Final Clinical Impressions(s) / UC Diagnoses   Final diagnoses:  Acute maxillary sinusitis, recurrence not specified  Sinus pressure  Allergic rhinitis, unspecified seasonality, unspecified trigger  Cough, unspecified type     Discharge Instructions      Patient to take medications as directed with food to completion.  Advised patient to take prednisone and Allegra with first dose of Augmentin for the next 10 days.  Advised may discontinue Allegra after 5 days and use as needed for concurrent postnasal drainage/drip.  Advised may use Tessalon Perles daily or as needed for cough.  Advised may use Promethazine DM at night for cough due to sedate of  effects.  Advised patient to increase daily water intake to 64 ounces per day while taking these medications.  Advised if symptoms worsen and/or unresolved please follow-up with PCP or here for further evaluation.     ED Prescriptions     Medication Sig Dispense Auth. Provider   amoxicillin-clavulanate (AUGMENTIN) 875-125 MG tablet Take 1 tablet by mouth 2 (two) times daily for 10 days. 20 tablet Trevor Iha, FNP   predniSONE (STERAPRED UNI-PAK 21 TAB) 10 MG (21) TBPK tablet Take by mouth daily. Take 6 tabs by mouth daily  for 2 days, then 5 tabs for 2 days, then 4 tabs for 2 days, then 3 tabs for 2 days, 2 tabs for 2 days, then 1 tab by mouth daily for 2 days 42 tablet Trevor Iha, FNP   fexofenadine [facility] ALLERGY) 180 MG tablet Take 1 tablet (180 mg total) by mouth daily for 15 days. 15 tablet Trevor Iha, FNP   benzonatate (TESSALON) 200 MG capsule Take 1 capsule (200 mg total) by mouth 3 (three) times daily as needed for up to 7 days. 40 capsule Trevor Iha, FNP   promethazine-dextromethorphan (PROMETHAZINE-DM) 6.25-15 MG/5ML syrup Take 5 mLs by mouth 2 (two) times daily as needed for cough. 118 mL Trevor Iha, FNP      PDMP not reviewed this encounter.   Trevor Iha, FNP 01/05/23 [phone]

## 2023-01-05 NOTE — Discharge Instructions (Addendum)
Patient to take medications as directed with food to completion.  Advised patient to take prednisone and Allegra with first dose of Augmentin for the next 10 days.  Advised may discontinue Allegra after 5 days and use as needed for concurrent postnasal drainage/drip.  Advised may use Tessalon Perles daily or as needed for cough.  Advised may use Promethazine DM at night for cough due to sedate of effects.  Advised patient to increase daily water intake to 64 ounces per day while taking these medications.  Advised if symptoms worsen and/or unresolved please follow-up with PCP or here for further evaluation.

## 2023-02-23 ENCOUNTER — Ambulatory Visit
Admission: EM | Admit: 2023-02-23 | Discharge: 2023-02-23 | Disposition: A | Discharge status: Home / Self Care | Payer: 59 | Attending: Internal Medicine | Admitting: Internal Medicine

## 2023-02-23 ENCOUNTER — Encounter: Payer: Self-pay | Admitting: Emergency Medicine

## 2023-02-23 DIAGNOSIS — J069 Acute upper respiratory infection, unspecified: Secondary | ICD-10-CM | POA: Diagnosis not present

## 2023-02-23 LAB — POC SARS CORONAVIRUS 2 AG -  ED: SARS Coronavirus 2 Ag: NEGATIVE

## 2023-02-23 MED ORDER — GUAIFENESIN ER 1200 MG PO TB12: 1200.0000 mg | ORAL_TABLET | Freq: Two times a day (BID) | ORAL | 0 refills | Status: DC

## 2023-02-23 MED ORDER — PROMETHAZINE-DM 6.25-15 MG/5ML PO SYRP: 5.0000 mL | ORAL_SOLUTION | Freq: Every evening | ORAL | 0 refills | Status: DC | PRN

## 2023-02-23 MED ORDER — BENZONATATE 100 MG PO CAPS: 100.0000 mg | ORAL_CAPSULE | Freq: Three times a day (TID) | ORAL | 0 refills | Status: DC

## 2023-02-23 NOTE — ED Provider Notes (Signed)
[name]    CSN: [id number] Arrival date & time: 02/23/23  0931      History   Chief Complaint Chief Complaint  Patient presents with   Nasal Drainage    HPI Thomas Terrell is a 32 y.o. male.   Patient with history of autoimmune hepatitis and cirrhosis of the liver not due to alcohol presents to urgent care for evaluation of nasal congestion, cough, sore throat, body aches, and generalized fatigue that started yesterday.  Reports recent positive sick contacts with COVID-19.  Cough is dry and nonproductive, denies chest pain, shortness of breath, heart palpitations.  No nausea, vomiting, diarrhea, abdominal pain, fever/chills, dizziness, rash.  Symptom onset was gradual.  History of asthma as a child, has not needed an inhaler in "years".  Never smoker, denies drug use.  Taking DayQuil without relief.     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)     Patient Active Problem List   Diagnosis Date Noted   Class 2 obesity due to excess calories without serious comorbidity with body mass index (BMI) of 35.0 to 35.9 in adult 01/22/2018   History of systemic steroid therapy 07/10/2014   Autoimmune hepatitis (HCC) 12/10/2010    Past Surgical History:  Procedure Laterality Date   LIVER BIOPSY     x 2       Home Medications    Prior to Admission medications   Medication Sig Start Date End Date Taking? Authorizing Provider  azaTHIOprine (IMURAN) 50 MG tablet Take 50 mg by mouth daily. 3 tabs daily   Yes [provider]  benzonatate (TESSALON) 100 MG capsule Take 1 capsule (100 mg total) by mouth every 8 (eight) hours. 02/23/23  Yes Carlisle Beers, FNP  budesonide (ENTOCORT EC) 3 MG 24 hr capsule Take 3 mg by mouth every morning.   Yes [provider]  Guaifenesin 1200 MG TB12 Take 1 tablet (1,200 mg total) by mouth in  the morning and at bedtime. 02/23/23  Yes Carlisle Beers, FNP  promethazine-dextromethorphan (PROMETHAZINE-DM) 6.25-15 MG/5ML syrup Take 5 mLs by mouth at bedtime as needed for cough. 02/23/23  Yes Carlisle Beers, FNP  ursodiol (ACTIGALL) 300 MG capsule Take 600 mg by mouth 2 (two) times daily. 06/26/21  Yes [provider]  fexofenadine (ALLEGRA ALLERGY) 180 MG tablet Take 1 tablet (180 mg total) by mouth daily for 15 days. 01/05/23 01/20/23  Trevor Iha, FNP  predniSONE (STERAPRED UNI-PAK 21 TAB) 10 MG (21) TBPK tablet Take by mouth daily. Take 6 tabs by mouth daily  for 2 days, then 5 tabs for 2 days, then 4 tabs for 2 days, then 3 tabs for 2 days, 2 tabs for 2 days, then 1 tab by mouth daily for 2 days 01/05/23   Trevor Iha, FNP    Family History Family History  Problem Relation Age of Onset   Diabetes Father    Heart disease Father    Hypertension Father    Breast cancer Maternal Grandmother    Breast cancer Paternal Grandmother    Colon cancer Neg Hx    Prostate cancer Neg Hx     Social History Social History   Tobacco Use   Smoking status: Never   Smokeless tobacco: Never  Vaping Use   Vaping status: Never Used  Substance Use Topics   Alcohol use:  No   Drug use: No     Allergies   Latex   Review of Systems Review of Systems Per HPI  Physical Exam Triage Vital Signs ED Triage Vitals  Encounter Vitals Group     BP 02/23/23 0944 (!) 145/85     Systolic BP Percentile --      Diastolic BP Percentile --      Pulse Rate 02/23/23 0944 79     Resp 02/23/23 0944 18     Temp 02/23/23 0944 97.8 F (36.6 C)     Temp Source 02/23/23 0944 Oral     SpO2 02/23/23 0944 98 %     Weight 02/23/23 0945 270 lb (122.5 kg)     Height 02/23/23 0945 6' (1.829 m)     Head Circumference --      Peak Flow --      Pain Score 02/23/23 0945 3     Pain Loc --      Pain Education --      Exclude from Growth Chart --    No data found.  Updated Vital Signs BP  (!) 145/85 (BP Location: Right Arm)   Pulse 79   Temp 97.8 F (36.6 C) (Oral)   Resp 18   Ht 6' (1.829 m)   Wt 270 lb (122.5 kg)   SpO2 98%   BMI 36.62 kg/m   Visual Acuity Right Eye Distance:   Left Eye Distance:   Bilateral Distance:    Right Eye Near:   Left Eye Near:    Bilateral Near:     Physical Exam Vitals and nursing note reviewed.  Constitutional:      Appearance: He is ill-appearing. He is not toxic-appearing.  HENT:     Head: Normocephalic and atraumatic.     Right Ear: Hearing, tympanic membrane, ear canal and external ear normal.     Left Ear: Hearing, tympanic membrane, ear canal and external ear normal.     Nose: Congestion present.     Mouth/Throat:     Lips: Pink.     Mouth: Mucous membranes are moist. No injury.     Tongue: No lesions. Tongue does not deviate from midline.     Palate: No mass and lesions.     Pharynx: Oropharynx is clear. Uvula midline. No pharyngeal swelling, oropharyngeal exudate, posterior oropharyngeal erythema or uvula swelling.     Tonsils: No tonsillar exudate or tonsillar abscesses.  Eyes:     General: Lids are normal. Vision grossly intact. Gaze aligned appropriately.     Extraocular Movements: Extraocular movements intact.     Conjunctiva/sclera: Conjunctivae normal.  Cardiovascular:     Rate and Rhythm: Normal rate and regular rhythm.     Heart sounds: Normal heart sounds, S1 normal and S2 normal.  Pulmonary:     Effort: Pulmonary effort is normal. No respiratory distress.     Breath sounds: Normal breath sounds and air entry. No wheezing, rhonchi or rales.     Comments: Frequent harsh cough elicited with deep inspiration.  No adventitious lung sounds heard to auscultation.  Speaking in full sentences without difficulty. Chest:     Chest wall: No tenderness.  Musculoskeletal:     Cervical back: Neck supple.  Skin:    General: Skin is warm and dry.     Capillary Refill: Capillary refill takes less than 2 seconds.      Findings: No rash.  Neurological:     General: No focal deficit present.  Mental Status: He is alert and oriented to person, place, and time. Mental status is at baseline.     Cranial Nerves: No dysarthria or facial asymmetry.  Psychiatric:        Mood and Affect: Mood normal.        Speech: Speech normal.        Behavior: Behavior normal.        Thought Content: Thought content normal.        Judgment: Judgment normal.      UC Treatments / Results  Labs (all labs ordered are listed, but only abnormal results are displayed) Labs Reviewed  POC SARS CORONAVIRUS 2 AG -  ED    EKG   Radiology No results found.  Procedures Procedures (including critical care time)  Medications Ordered in UC Medications - No data to display  Initial Impression / Assessment and Plan / UC Course  I have reviewed the triage vital signs and the nursing notes.  Pertinent labs & imaging results that were available during my care of the patient were reviewed by me and considered in my medical decision making (see chart for details).   1. Viral URI with cough Suspect viral URI, viral syndrome. Physical exam findings reassuring, vital signs hemodynamically stable. Deferred imaging of the chest based on above findings, low suspicion for pneumonia.  Advised supportive care, offered prescriptions for symptomatic relief. Recommend continued use of OTC medications as needed, recommendations discussed with patient/caregiver and outlined in AVS. POC COVID-19 testing negative in clinic. Low suspicion for influenza/strep.  Counseled patient on potential for adverse effects with medications prescribed/recommended today, strict ER and return-to-clinic precautions discussed, patient verbalized understanding.    Final Clinical Impressions(s) / UC Diagnoses   Final diagnoses:  Viral URI with cough     Discharge Instructions      You have a viral illness which will improve on its own with rest,  fluids, and medications to help with your symptoms. We discussed prescriptions that may help with your symptoms: tessalon perles, promethazine DM You may use over the counter medicines as needed: tylenol/motrin, mucinex, zyrtec, Flonase Two teaspoons of honey in 1 cup of warm water every 4-6 hours may help with throat pains. Humidifier in room at nighttime may help soothe cough (clean well daily).   For chest pain, shortness of breath, inability to keep food or fluids down without vomiting, fever that does not respond to tylenol or motrin, or any other severe symptoms, please go to the ER for further evaluation. Return to urgent care as needed, otherwise follow-up with PCP.       ED Prescriptions     Medication Sig Dispense Auth. Provider   Guaifenesin 1200 MG TB12 Take 1 tablet (1,200 mg total) by mouth in the morning and at bedtime. 14 tablet Reita May M, FNP   benzonatate (TESSALON) 100 MG capsule Take 1 capsule (100 mg total) by mouth every 8 (eight) hours. 21 capsule Carlisle Beers, FNP   promethazine-dextromethorphan (PROMETHAZINE-DM) 6.25-15 MG/5ML syrup Take 5 mLs by mouth at bedtime as needed for cough. 118 mL Carlisle Beers, FNP      PDMP not reviewed this encounter.   Carlisle Beers, [place] 02/23/23 1220

## 2023-02-23 NOTE — Discharge Instructions (Signed)
You have a viral illness which will improve on its own with rest, fluids, and medications to help with your symptoms. We discussed prescriptions that may help with your symptoms: tessalon perles, promethazine DM You may use over the counter medicines as needed: tylenol/motrin, mucinex, zyrtec, Flonase Two teaspoons of honey in 1 cup of warm water every 4-6 hours may help with throat pains. Humidifier in room at nighttime may help soothe cough (clean well daily).   For chest pain, shortness of breath, inability to keep food or fluids down without vomiting, fever that does not respond to tylenol or motrin, or any other severe symptoms, please go to the ER for further evaluation. Return to urgent care as needed, otherwise follow-up with PCP.

## 2023-02-23 NOTE — ED Triage Notes (Signed)
Patient c/o nasal drainage, cough, congestion, no taste since last night.  Patient has taken Dayquil.  Exposed to someone getting over COVID.

## 2023-04-16 ENCOUNTER — Telehealth: Payer: Self-pay

## 2023-04-16 ENCOUNTER — Ambulatory Visit
Admission: EM | Admit: 2023-04-16 | Discharge: 2023-04-16 | Disposition: A | Discharge status: Home / Self Care | Payer: 59 | Attending: Family Medicine | Admitting: Family Medicine

## 2023-04-16 ENCOUNTER — Other Ambulatory Visit: Payer: Self-pay

## 2023-04-16 DIAGNOSIS — D849 Immunodeficiency, unspecified: Secondary | ICD-10-CM

## 2023-04-16 DIAGNOSIS — J019 Acute sinusitis, unspecified: Secondary | ICD-10-CM

## 2023-04-16 DIAGNOSIS — B9689 Other specified bacterial agents as the cause of diseases classified elsewhere: Secondary | ICD-10-CM

## 2023-04-16 MED ORDER — AMOXICILLIN-POT CLAVULANATE 875-125 MG PO TABS: 1.0000 | ORAL_TABLET | Freq: Two times a day (BID) | ORAL | 0 refills | Status: DC

## 2023-04-16 MED ORDER — PREDNISONE 50 MG PO TABS
ORAL_TABLET | ORAL | 0 refills | Status: DC
Start: 2023-04-16 — End: 2023-08-14

## 2023-04-16 MED ORDER — PREDNISONE 50 MG PO TABS: ORAL_TABLET | ORAL | 0 refills | Status: DC

## 2023-04-16 NOTE — Discharge Instructions (Addendum)
Drink lots of water Take the Augmentin antibiotic 2 times a day for a week Consider a probiotic while taking Augmentin Take prednisone once a day for 5 days Call return if not improving by next week

## 2023-04-16 NOTE — ED Triage Notes (Signed)
C/o sinus congestion, sneezing, mucus has blood in it, feeling a little light headed, headache. Symptoms x 1 week. No fever.

## 2023-04-16 NOTE — ED Provider Notes (Signed)
[name]    CSN: [id number] Arrival date & time: 04/16/23  1136      History   Chief Complaint Chief Complaint  Patient presents with   Facial Pain    HPI Thomas Terrell is a 32 y.o. male.   HPI  Patient is known to me from prior visit.  He is here today with symptoms over a week, of sinus infection.  He has sinus congestion, purulent mucus with blood streaks, sore throat, fatigue, headache, lightheadedness, ear pressure and pain.  No chest congestion or cough. 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 Authorizing Provider  amoxicillin-clavulanate (AUGMENTIN) 875-125 MG tablet Take 1 tablet by mouth every 12 (twelve) hours. 04/16/23   Eustace Moore, MD  azaTHIOprine (IMURAN) 50 MG tablet Take 50 mg by mouth daily. 3 tabs daily    [provider]  budesonide (ENTOCORT EC) 3 MG 24 hr capsule Take 3 mg by mouth every morning.    [provider]  predniSONE (DELTASONE) 50 MG tablet Take once a day for 5 days.  Take with food 04/16/23   Eustace Moore, MD  ursodiol (ACTIGALL) 300 MG capsule Take 600 mg by mouth 2 (two) times daily. 06/26/21   [provider]    Family History Family History  Problem Relation Age of Onset   Diabetes Father    Heart disease Father     Hypertension Father    Breast cancer Maternal Grandmother    Breast cancer Paternal Grandmother    Colon cancer Neg Hx    Prostate cancer Neg Hx     Social History Social History   Tobacco Use   Smoking status: Never   Smokeless tobacco: Never  Vaping Use   Vaping status: Never Used  Substance Use Topics   Alcohol use: No   Drug use: No     Allergies   Latex   Review of Systems Review of Systems  See HPI Physical Exam Triage Vital Signs ED Triage Vitals  Encounter Vitals Group     BP 04/16/23 1144 134/80     Systolic BP Percentile --      Diastolic BP Percentile --      Pulse Rate 04/16/23 1144 68     Resp 04/16/23 1144 16     Temp 04/16/23 1144 97.9 F (36.6 C)     Temp src --      SpO2 04/16/23 1144 100 %     Weight --      Height --      Head Circumference --      Peak Flow --      Pain Score 04/16/23 1146 3     Pain Loc --  Pain Education --      Exclude from Growth Chart --    No data found.  Updated Vital Signs BP 134/80   Pulse 68   Temp 97.9 F (36.6 C)   Resp 16   SpO2 100%      Physical Exam Constitutional:      General: He is not in acute distress.    Appearance: He is well-developed. He is ill-appearing.  HENT:     Head: Normocephalic and atraumatic.     Ears:     Comments: Both TMs are injected.  Nasal membranes swollen and red.  Facial sinuses are tender.    Nose: Congestion present. No rhinorrhea.     Mouth/Throat:     Pharynx: Posterior oropharyngeal erythema present.  Eyes:     Conjunctiva/sclera: Conjunctivae normal.     Pupils: Pupils are equal, round, and reactive to light.  Cardiovascular:     Rate and Rhythm: Normal rate and regular rhythm.     Heart sounds: Normal heart sounds.  Pulmonary:     Effort: Pulmonary effort is normal. No respiratory distress.     Breath sounds: Normal breath sounds.  Abdominal:     General: There is no distension.     Palpations: Abdomen is soft.  Musculoskeletal:         General: Normal range of motion.     Cervical back: Normal range of motion.  Lymphadenopathy:     Cervical: Cervical adenopathy present.  Skin:    General: Skin is warm and dry.  Neurological:     Mental Status: He is alert.      UC Treatments / Results  Labs (all labs ordered are listed, but only abnormal results are displayed) Labs Reviewed - No data to display  EKG   Radiology No results found.  Procedures Procedures (including critical care time)  Medications Ordered in UC Medications - No data to display  Initial Impression / Assessment and Plan / UC Course  I have reviewed the triage vital signs and the nursing notes.  Pertinent labs & imaging results that were available during my care of the patient were reviewed by me and considered in my medical decision making (see chart for details).     Final Clinical Impressions(s) / UC Diagnoses   Final diagnoses:  Acute bacterial sinusitis  Immune deficiency disorder (HCC)     Discharge Instructions      Drink lots of water Take the Augmentin antibiotic 2 times a day for a week Consider a probiotic while taking Augmentin Take prednisone once a day for 5 days Call return if not improving by next week     ED Prescriptions     Medication Sig Dispense Auth. Provider   amoxicillin-clavulanate (AUGMENTIN) 875-125 MG tablet Take 1 tablet by mouth every 12 (twelve) hours. 14 tablet Eustace Moore, MD   predniSONE (DELTASONE) 50 MG tablet Take once a day for 5 days.  Take with food 5 tablet Eustace Moore, MD      PDMP not reviewed this encounter.   Eustace Moore, MD 04/16/23 1254

## 2023-05-12 ENCOUNTER — Ambulatory Visit
Admission: EM | Admit: 2023-05-12 | Discharge: 2023-05-12 | Disposition: A | Discharge status: Home / Self Care | Payer: 59 | Attending: Family Medicine | Admitting: Family Medicine

## 2023-05-12 ENCOUNTER — Other Ambulatory Visit: Payer: Self-pay

## 2023-05-12 DIAGNOSIS — J0100 Acute maxillary sinusitis, unspecified: Secondary | ICD-10-CM | POA: Diagnosis not present

## 2023-05-12 DIAGNOSIS — R051 Acute cough: Secondary | ICD-10-CM | POA: Diagnosis not present

## 2023-05-12 MED ORDER — PROMETHAZINE-DM 6.25-15 MG/5ML PO SYRP: 5.0000 mL | ORAL_SOLUTION | Freq: Three times a day (TID) | ORAL | 0 refills | Status: AC | PRN

## 2023-05-12 MED ORDER — AMOXICILLIN-POT CLAVULANATE 875-125 MG PO TABS: 1.0000 | ORAL_TABLET | Freq: Two times a day (BID) | ORAL | 0 refills | Status: AC

## 2023-05-12 NOTE — ED Provider Notes (Signed)
[facility]    CSN: [id number] Arrival date & time: 05/12/23  1525      History   Chief Complaint Chief Complaint  Patient presents with   Headache   Dizziness    HPI Thomas Terrell is a 32 y.o. male.  Patient presents today for evaluation of nasal congestion, headache, dizziness, productive cough, runny nose for little over a week.  Patient was seen in clinic a month ago and treated for sinus infection reports all symptoms resolved and current symptoms are new.  He denies any fever, shortness of breath or wheezing.  Endorses occasional chest tightness with coughing. He's had no known sick contacts. 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 Authorizing Provider  promethazine-dextromethorphan (PROMETHAZINE-DM) 6.25-15 MG/5ML syrup Take 5 mLs by mouth 3 (three) times daily as needed for cough. 05/12/23  Yes Bing Neighbors, NP  amoxicillin-clavulanate (AUGMENTIN) 875-125 MG tablet Take 1 tablet by mouth 2 (two) times daily for 10 days. 05/12/23 05/22/23  Bing Neighbors, NP  azaTHIOprine (IMURAN) 50 MG tablet Take 50 mg by mouth daily. 3 tabs daily    [provider]  budesonide (ENTOCORT EC) 3 MG 24 hr capsule Take 3 mg by mouth every morning.    [provider]  predniSONE (DELTASONE) 50 MG tablet Take once a day for 5 days.  Take with food 04/16/23   Eustace Moore,  MD  ursodiol (ACTIGALL) 300 MG capsule Take 600 mg by mouth 2 (two) times daily. 06/26/21   [provider]    Family History Family History  Problem Relation Age of Onset   Diabetes Father    Heart disease Father    Hypertension Father    Breast cancer Maternal Grandmother    Breast cancer Paternal Grandmother    Colon cancer Neg Hx    Prostate cancer Neg Hx     Social History Social History   Tobacco Use   Smoking status: Never   Smokeless tobacco: Never  Vaping Use   Vaping status: Never Used  Substance Use Topics   Alcohol use: No   Drug use: No     Allergies   Latex   Review of Systems Review of Systems  Neurological:  Positive for dizziness and headaches.     Physical Exam Triage Vital Signs ED Triage Vitals  Encounter Vitals Group     BP 05/12/23 1545 128/78     Systolic BP Percentile --      Diastolic BP Percentile --      Pulse Rate 05/12/23 1545 87     Resp 05/12/23 1545 16     Temp 05/12/23 1545 97.7 F (36.5 C)     Temp src --      SpO2 05/12/23 1545 98 %     Weight --  Height --      Head Circumference --      Peak Flow --      Pain Score 05/12/23 1543 5     Pain Loc --      Pain Education --      Exclude from Growth Chart --    No data found.  Updated Vital Signs BP 128/78   Pulse 87   Temp 97.7 F (36.5 C)   Resp 16   SpO2 98%   Visual Acuity Right Eye Distance:   Left Eye Distance:   Bilateral Distance:    Right Eye Near:   Left Eye Near:    Bilateral Near:     Physical Exam Vitals reviewed.  Constitutional:      Appearance: He is ill-appearing.  HENT:     Head: Normocephalic and atraumatic.     Nose: Congestion and rhinorrhea present.     Mouth/Throat:     Pharynx: No oropharyngeal exudate or posterior oropharyngeal erythema.  Eyes:     Extraocular Movements: Extraocular movements intact.     Pupils: Pupils are equal, round, and reactive to light.  Cardiovascular:     Rate and Rhythm: Normal  rate and regular rhythm.  Pulmonary:     Effort: Pulmonary effort is normal.     Breath sounds: Normal breath sounds.  Musculoskeletal:     Cervical back: Normal range of motion.  Lymphadenopathy:     Cervical: No cervical adenopathy.  Skin:    General: Skin is warm and dry.     Capillary Refill: Capillary refill takes less than 2 seconds.  Neurological:     General: No focal deficit present.     Mental Status: He is alert and oriented to person, place, and time.      UC Treatments / Results  Labs (all labs ordered are listed, but only abnormal results are displayed) Labs Reviewed - No data to display  EKG   Radiology No results found.  Procedures Procedures (including critical care time)  Medications Ordered in UC Medications - No data to display  Initial Impression / Assessment and Plan / UC Course  I have reviewed the triage vital signs and the nursing notes.  Pertinent labs & imaging results that were available during my care of the patient were reviewed by me and considered in my medical decision making (see chart for details).   Acute nonrecurrent maxillary sinusitis with acute cough.  Empiric treatment for sinusitis Augmentin twice daily for 10 days.  Promethazine DM 3 times daily as needed for cough and congestion.  Return precautions given if symptoms worsen or do not improve. Final Clinical Impressions(s) / UC Diagnoses   Final diagnoses:  Acute non-recurrent maxillary sinusitis  Acute cough     Discharge Instructions      Complete entire course of medication.  Return if symptoms worsen or do not improve.     ED Prescriptions     Medication Sig Dispense Auth. Provider   amoxicillin-clavulanate (AUGMENTIN) 875-125 MG tablet Take 1 tablet by mouth 2 (two) times daily for 10 days. 20 tablet Bing Neighbors, NP   promethazine-dextromethorphan (PROMETHAZINE-DM) 6.25-15 MG/5ML syrup Take 5 mLs by mouth 3 (three) times daily as needed for cough. 180  mL Bing Neighbors, NP      PDMP not reviewed this encounter.   Bing Neighbors, NP 05/12/23 1725

## 2023-05-12 NOTE — ED Triage Notes (Signed)
Pt presents to uc with co of ha and dizziness for one week. Pt reports ha is across forehead 5/10 with new onset of runny nose and cough last night. Pt reports he has been taking musinex and dayquil with minimal improvement.

## 2023-05-12 NOTE — Discharge Instructions (Signed)
Complete entire course of medication.  Return if symptoms worsen or do not improve.

## 2023-05-25 IMAGING — DX DG FOOT COMPLETE 3+V*R*
3 series · 3 of 3 positions shown · non-contrast
Comparison: None.

CLINICAL DATA: Right foot pain laterally after fall today down
Jouel, tripped, rolling injury

EXAM:
RIGHT FOOT COMPLETE - 3+ VIEW

[foot ap]
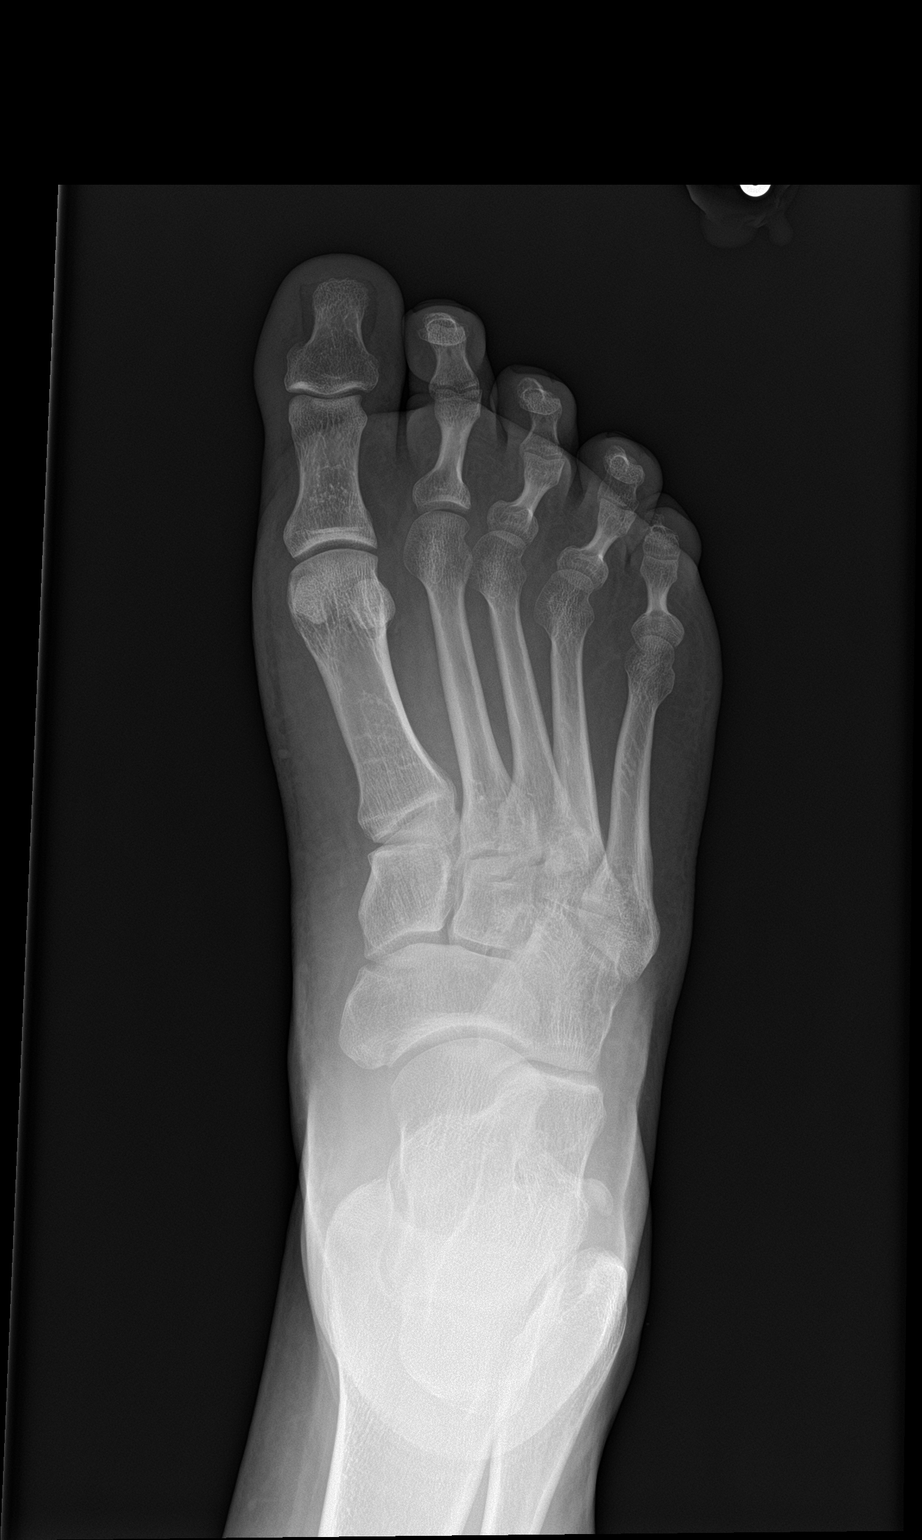

[foot obl]
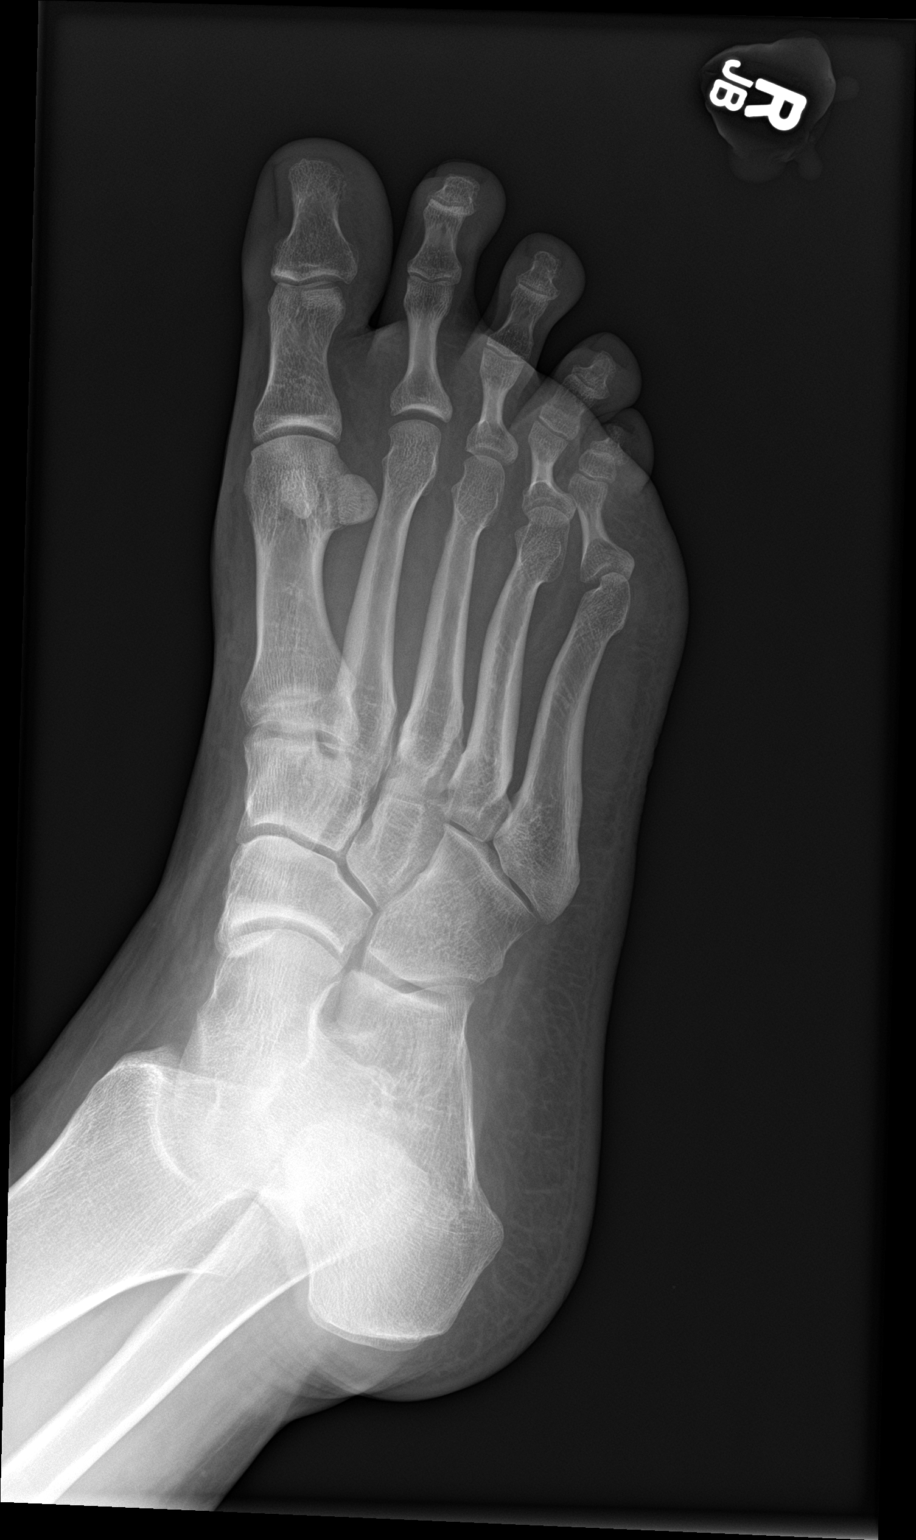

[foot lat]
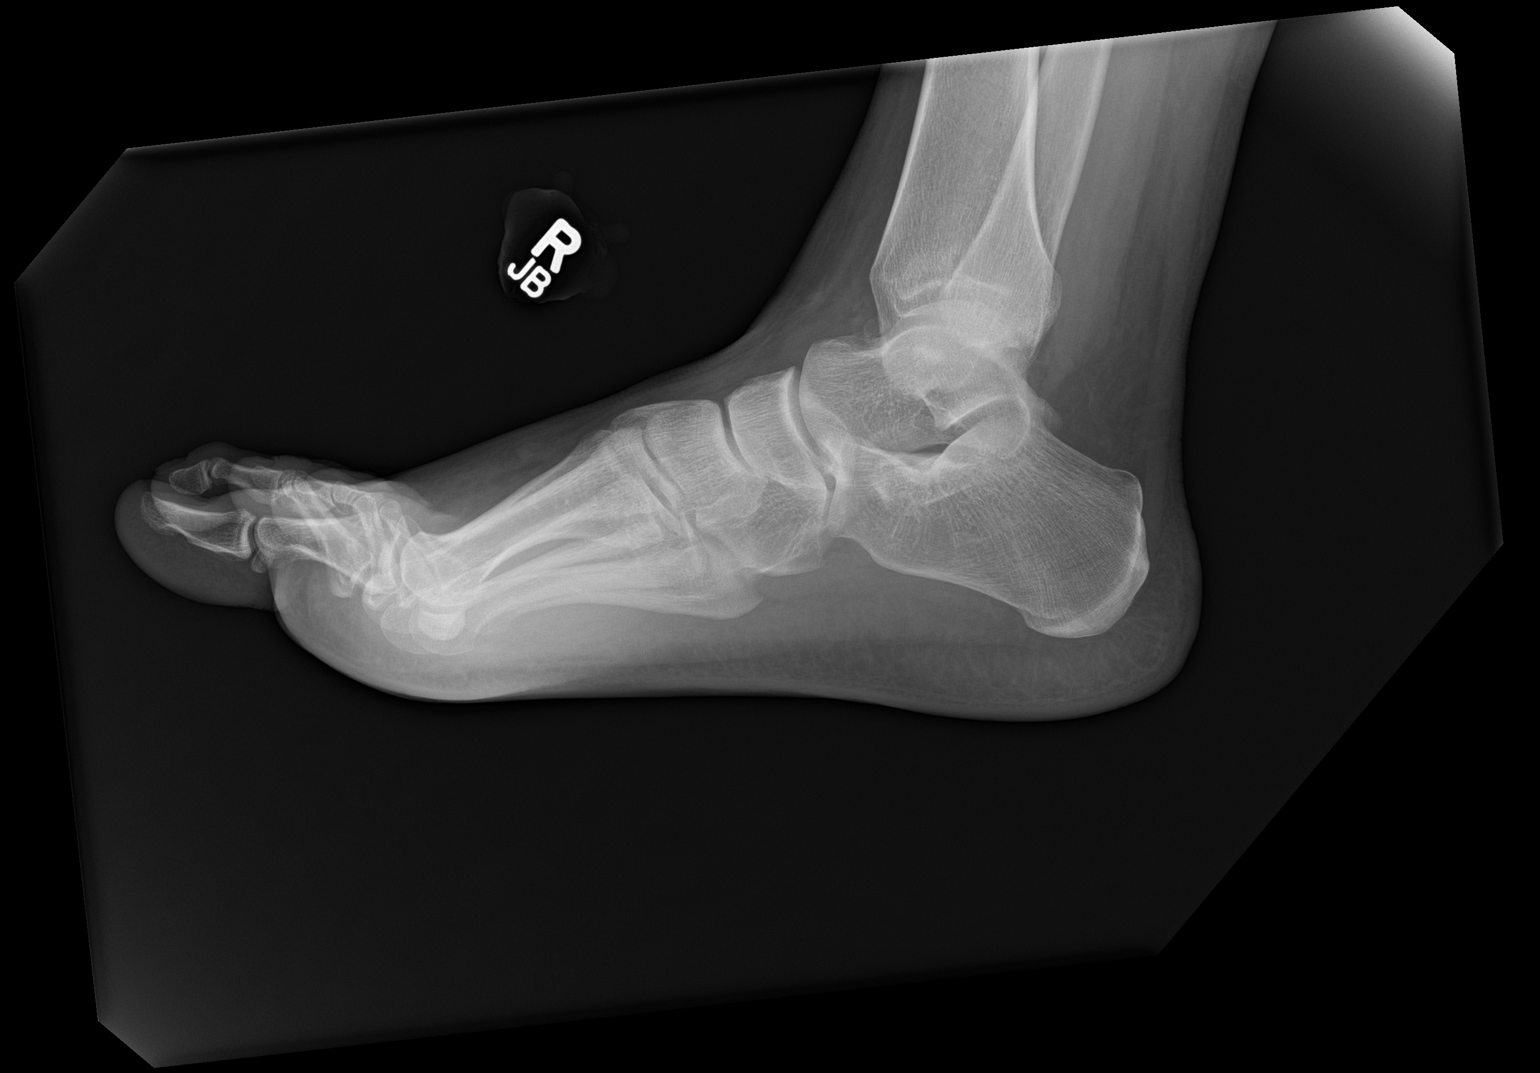

[3 of 3 positions shown; findings below may reference images not displayed]

FINDINGS: There is no evidence of fracture or dislocation. There is no
evidence of arthropathy or other focal bone abnormality. Soft
tissues are unremarkable.
IMPRESSION: Negative.

## 2023-08-14 ENCOUNTER — Other Ambulatory Visit: Payer: Self-pay

## 2023-08-14 ENCOUNTER — Ambulatory Visit
Admission: EM | Admit: 2023-08-14 | Discharge: 2023-08-14 | Disposition: A | Discharge status: Home / Self Care | Attending: Internal Medicine | Admitting: Internal Medicine

## 2023-08-14 DIAGNOSIS — J069 Acute upper respiratory infection, unspecified: Secondary | ICD-10-CM | POA: Diagnosis not present

## 2023-08-14 LAB — POCT INFLUENZA A/B
Influenza A, POC: NEGATIVE
Influenza B, POC: NEGATIVE

## 2023-08-14 MED ORDER — PREDNISONE 20 MG PO TABS: 40.0000 mg | ORAL_TABLET | Freq: Every day | ORAL | 0 refills | Status: AC

## 2023-08-14 MED ORDER — BENZONATATE 100 MG PO CAPS: 100.0000 mg | ORAL_CAPSULE | Freq: Three times a day (TID) | ORAL | 0 refills | Status: AC

## 2023-08-14 NOTE — ED Provider Notes (Signed)
 [name]    CSN: [id number] Arrival date & time: 08/14/23  0840      History   Chief Complaint Chief Complaint  Patient presents with   Cough    HPI Thomas Terrell is a 33 y.o. male.   33 year old male who presents to urgent care with complaints of cough, runny nose, congestion, dizziness and headache.  His symptoms started on Friday.  He has been taking DayQuil and Mucinex without much relief.  He reports that he has been at the hospital visiting his father who was admitted for dehydration and missing medications but not for flu or COVID that he knows of.  He reports his sister has similar symptoms.  He has not done any at home testing.  He denies any fevers that he knows of but has had hot flashes at times.   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 Authorizing Provider  azaTHIOprine (IMURAN) 50 MG tablet Take 50 mg by mouth daily. 3 tabs daily    [provider]  budesonide (ENTOCORT EC) 3 MG 24 hr capsule Take 3 mg by mouth every morning.    [provider]  predniSONE (DELTASONE) 50 MG tablet Take once a day for 5 days.  Take with food 04/16/23   Eustace Moore, MD   promethazine-dextromethorphan (PROMETHAZINE-DM) 6.25-15 MG/5ML syrup Take 5 mLs by mouth 3 (three) times daily as needed for cough. 05/12/23   Bing Neighbors, NP  ursodiol (ACTIGALL) 300 MG capsule Take 600 mg by mouth 2 (two) times daily. 06/26/21   [provider]    Family History Family History  Problem Relation Age of Onset   Diabetes Father    Heart disease Father    Hypertension Father    Breast cancer Maternal Grandmother    Breast cancer Paternal Grandmother    Colon cancer Neg Hx    Prostate cancer Neg Hx     Social History Social History   Tobacco Use   Smoking status: Never   Smokeless tobacco: Never  Vaping Use   Vaping status: Never Used  Substance Use Topics   Alcohol use: No   Drug use: No     Allergies   Latex   Review of Systems Review of Systems  Constitutional:  Negative for chills and fever.  HENT:  Positive for congestion, rhinorrhea, sinus pressure and sinus pain. Negative for ear pain and sore throat.   Eyes:  Negative for pain and visual disturbance.  Respiratory:  Positive for cough. Negative for shortness of breath.   Cardiovascular:  Negative for chest pain and palpitations.  Gastrointestinal:  Negative for abdominal pain and vomiting.  Genitourinary:  Negative for dysuria and hematuria.  Musculoskeletal:  Negative for arthralgias and back pain.  Skin:  Negative for color change and rash.  Neurological:  Negative for seizures and syncope.  All other systems reviewed and are negative.    Physical Exam Triage Vital Signs ED Triage Vitals  Encounter Vitals Group     BP 08/14/23 0903 133/81     Systolic BP Percentile --      Diastolic BP Percentile --      Pulse Rate 08/14/23 0903 80     Resp 08/14/23 0903 16     Temp 08/14/23 0903 98 F (36.7 C)     Temp src --      SpO2 08/14/23 0903 97 %     Weight --      Height --      Head Circumference --      Peak Flow --      Pain Score 08/14/23 0906 1     Pain Loc --       Pain Education --      Exclude from Growth Chart --    No data found.  Updated Vital Signs BP 133/81   Pulse 80   Temp 98 F (36.7 C)   Resp 16   SpO2 97%   Visual Acuity Right Eye Distance:   Left Eye Distance:   Bilateral Distance:    Right Eye Near:   Left Eye Near:    Bilateral Near:     Physical Exam Vitals and nursing note reviewed.  Constitutional:      General: He is not in acute distress.    Appearance: He is well-developed.  HENT:     Head: Normocephalic and atraumatic.     Right Ear: Tympanic membrane normal.     Left Ear: Tympanic membrane normal.     Nose: Congestion and rhinorrhea present.     Right Turbinates: Swollen.     Left Turbinates: Swollen.     Mouth/Throat:     Mouth: Mucous membranes are moist.  Eyes:     Conjunctiva/sclera: Conjunctivae normal.  Cardiovascular:     Rate and Rhythm: Normal rate and regular rhythm.     Heart sounds: No murmur heard. Pulmonary:     Effort: Pulmonary effort is normal. No respiratory distress.     Breath sounds: Normal breath sounds.  Abdominal:     Palpations: Abdomen is soft.     Tenderness: There is no abdominal tenderness.  Musculoskeletal:        General: No swelling.     Cervical back: Neck supple.  Skin:    General: Skin is warm and dry.     Capillary Refill: Capillary refill takes less than 2 seconds.  Neurological:     Mental Status: He is alert.  Psychiatric:        Mood and Affect: Mood normal.      UC Treatments / Results  Labs (all labs ordered are listed, but only abnormal results are displayed) Labs Reviewed  POCT INFLUENZA A/B    EKG   Radiology No results found.  Procedures Procedures (including critical care time)  Medications Ordered in UC Medications - No data to display  Initial Impression / Assessment and Plan / UC Course  I have reviewed the triage vital signs and the nursing notes.  Pertinent labs & imaging results that were available during my care of  the patient were reviewed by me  and considered in my medical decision making (see chart for details).     Viral URI with cough   Symptoms are most consistent with a viral infection.  This does not require antibiotic treatment.  We focus treatment on improving the symptoms.  We will treat with the following:  Benzonatate (tessalon) 100 mg every 8 hours as needed for cough.   Prednisone 40 mg (2 tablets) once daily for 5 days. Take this in the morning.  This is a steroid to help with inflammation and pain.  May continue to use DayQuil and Mucinex Rest and stay hydrated.  Return to urgent care or PCP if symptoms worsen or fail to resolve.    Final Clinical Impressions(s) / UC Diagnoses   Final diagnoses:  None   Discharge Instructions   None    ED Prescriptions   None    PDMP not reviewed this encounter.   Landis Martins, [place] 08/14/23 1008

## 2023-08-14 NOTE — ED Triage Notes (Signed)
 Runny nose, headache, dizzy, cough since Friday. Sister is also sick. Has tried dayquil, mucinex.

## 2023-08-14 NOTE — Discharge Instructions (Addendum)
 Symptoms are most consistent with a viral infection.  This does not require antibiotic treatment.  We focus treatment on improving the symptoms.  We will treat with the following:  Benzonatate (tessalon) 100 mg every 8 hours as needed for cough.   Prednisone 40 mg (2 tablets) once daily for 5 days. Take this in the morning.  This is a steroid to help with inflammation and pain.  May continue to use DayQuil and Mucinex Rest and stay hydrated.  Return to urgent care or PCP if symptoms worsen or fail to resolve.

## 2023-12-21 ENCOUNTER — Ambulatory Visit
Admission: EM | Admit: 2023-12-21 | Discharge: 2023-12-21 | Disposition: A | Discharge status: Home / Self Care | Attending: Family Medicine | Admitting: Family Medicine

## 2023-12-21 DIAGNOSIS — R6889 Other general symptoms and signs: Secondary | ICD-10-CM

## 2023-12-21 LAB — POC SARS CORONAVIRUS 2 AG -  ED: SARS Coronavirus 2 Ag: NEGATIVE

## 2023-12-21 NOTE — ED Triage Notes (Signed)
 Pt presents to uc with co generalized body aches, sinus pain, headache and nausea for 3 days. Pt needs covid test. Pt reports his younger sister has been exposed to people with covid and she now has same symptoms as well.

## 2023-12-21 NOTE — Discharge Instructions (Addendum)
 Advised patient COVID-19 was negative today.  Advised may use OTC Tylenol 1000 mg every 6 hours for fever (oral temperature greater than 100.3).  Encouraged increase daily water intake to 64 ounces per day 7 days/week.  Advised if symptoms worsen and/or unresolved please follow-up with your PCP or here for further evaluation.

## 2023-12-21 NOTE — ED Provider Notes (Signed)
 [facility]    CSN: [id number] Arrival date & time: 12/21/23  1352      History   Chief Complaint Chief Complaint  Patient presents with   Generalized Body Aches   Nausea    HPI Thomas Terrell is a 33 y.o. male.   HPI pleasant 33 year old male presents with generalized bodyaches, sinus pain, headache and nausea for 3 days.  Patient request COVID-19 test. PMH significant for obesity, autoimmune hepatitis treated with steroids, cirrhosis of liver not due to alcohol.  Past Medical History:  Diagnosis Date   Autoimmune hepatitis treated with steroids (HCC)    Cholangitis, sclerosing    Cirrhosis of liver not due to alcohol [facility])     Patient Active Problem List   Diagnosis Date Noted   Class 2 obesity due to excess calories without serious comorbidity with body mass index (BMI) of 35.0 to 35.9 in adult 01/22/2018   History of systemic steroid therapy 07/10/2014   Autoimmune hepatitis (HCC) 12/10/2010    Past Surgical History:  Procedure Laterality Date   LIVER BIOPSY     x 2       Home Medications    Prior to Admission medications   Medication Sig Start Date End Date Taking? Authorizing Provider  azaTHIOprine (IMURAN) 50 MG tablet Take 50 mg by mouth daily. 3 tabs daily    [provider]  benzonatate  (TESSALON ) 100 MG capsule Take 1 capsule (100 mg total) by mouth every 8 (eight) hours. 08/14/23   White, Elizabeth A, PA-C  budesonide (ENTOCORT EC) 3 MG 24 hr capsule Take 3 mg by mouth every morning.    [provider]  promethazine -dextromethorphan (PROMETHAZINE -DM) 6.25-15 MG/5ML syrup Take 5 mLs by mouth 3 (three) times daily as needed for cough. 05/12/23   Arloa Suzen RAMAN, NP  ursodiol (ACTIGALL) 300 MG capsule Take 600 mg by mouth 2 (two) times daily. 06/26/21   [provider]    Family History Family History  Problem Relation Age of Onset   Diabetes Father    Heart disease Father    Hypertension Father    Breast  cancer Maternal Grandmother    Breast cancer Paternal Grandmother    Colon cancer Neg Hx    Prostate cancer Neg Hx     Social History Social History   Tobacco Use   Smoking status: Never   Smokeless tobacco: Never  Vaping Use   Vaping status: Never Used  Substance Use Topics   Alcohol use: No   Drug use: No     Allergies   Latex   Review of Systems Review of Systems  HENT:  Positive for sinus pain.   Musculoskeletal:  Positive for arthralgias and myalgias.  Neurological:  Positive for headaches.     Physical Exam Triage Vital Signs ED Triage Vitals  Encounter Vitals Group     BP      Girls Systolic BP Percentile      Girls Diastolic BP Percentile      Boys Systolic BP Percentile      Boys Diastolic BP Percentile      Pulse      Resp      Temp      Temp src      SpO2      Weight      Height      Head Circumference      Peak Flow      Pain Score      Pain  Loc      Pain Education      Exclude from Growth Chart    No data found.  Updated Vital Signs BP 130/87   Pulse 70   Temp 98.1 F (36.7 C)   Resp 20   SpO2 98%    Physical Exam Vitals and nursing note reviewed.  Constitutional:      Appearance: Normal appearance. He is obese.  HENT:     Head: Normocephalic and atraumatic.     Right Ear: Tympanic membrane, ear canal and external ear normal.     Left Ear: Tympanic membrane, ear canal and external ear normal.     Mouth/Throat:     Mouth: Mucous membranes are moist.     Pharynx: Oropharynx is clear.  Eyes:     Extraocular Movements: Extraocular movements intact.     Conjunctiva/sclera: Conjunctivae normal.     Pupils: Pupils are equal, round, and reactive to light.  Cardiovascular:     Rate and Rhythm: Normal rate and regular rhythm.     Pulses: Normal pulses.     Heart sounds: Normal heart sounds.  Pulmonary:     Effort: Pulmonary effort is normal.     Breath sounds: Normal breath sounds. No wheezing, rhonchi or rales.   Musculoskeletal:        General: Normal range of motion.  Skin:    General: Skin is warm and dry.  Neurological:     General: No focal deficit present.     Mental Status: He is alert and oriented to person, place, and time. Mental status is at baseline.  Psychiatric:        Mood and Affect: Mood normal.        Behavior: Behavior normal.      UC Treatments / Results  Labs (all labs ordered are listed, but only abnormal results are displayed) Labs Reviewed  POC SARS CORONAVIRUS 2 AG -  ED    EKG   Radiology No results found.  Procedures Procedures (including critical care time)  Medications Ordered in UC Medications - No data to display  Initial Impression / Assessment and Plan / UC Course  I have reviewed the triage vital signs and the nursing notes.  Pertinent labs & imaging results that were available during my care of the patient were reviewed by me and considered in my medical decision making (see chart for details).     MDM: 1.  Influenza-like symptoms-COVID-19 negative today-Advised patient COVID-19 was negative today.  Advised may use OTC Tylenol 1000 mg every 6 hours for fever (oral temperature greater than 100.3).  Encouraged increase daily water intake to 64 ounces per day 7 days/week.  Advised if symptoms worsen and/or unresolved please follow-up with your PCP or here for further evaluation.  Final Clinical Impressions(s) / UC Diagnoses   Final diagnoses:  Influenza-like symptoms     Discharge Instructions      Advised patient COVID-19 was negative today.  Advised may use OTC Tylenol 1000 mg every 6 hours for fever (oral temperature greater than 100.3).  Encouraged increase daily water intake to 64 ounces per day 7 days/week.  Advised if symptoms worsen and/or unresolved please follow-up with your PCP or here for further evaluation.     ED Prescriptions   None    PDMP not reviewed this encounter.   Teddy Sharper, FNP 12/21/23 [phone]
